# Patient Record
Sex: Female | Born: 1945
Health system: Southern US, Community
[De-identification: ages and names within clinical notes are randomized; demographics above are authoritative.]

## PROBLEM LIST (undated history)

## (undated) DIAGNOSIS — Z720 Tobacco use: Secondary | ICD-10-CM

## (undated) DIAGNOSIS — F1010 Alcohol abuse, uncomplicated: Secondary | ICD-10-CM

## (undated) DIAGNOSIS — G379 Demyelinating disease of central nervous system, unspecified: Secondary | ICD-10-CM

## (undated) HISTORY — DX: Tobacco use: Z72.0

## (undated) HISTORY — DX: Alcohol abuse, uncomplicated: F10.10

## (undated) HISTORY — DX: Demyelinating disease of central nervous system, unspecified: G37.9

---

## 2018-08-11 DIAGNOSIS — N390 Urinary tract infection, site not specified: Secondary | ICD-10-CM | POA: Diagnosis not present

## 2018-10-09 DIAGNOSIS — R634 Abnormal weight loss: Secondary | ICD-10-CM | POA: Diagnosis not present

## 2018-10-09 DIAGNOSIS — R399 Unspecified symptoms and signs involving the genitourinary system: Secondary | ICD-10-CM | POA: Diagnosis not present

## 2018-10-09 DIAGNOSIS — Z0000 Encounter for general adult medical examination without abnormal findings: Secondary | ICD-10-CM | POA: Diagnosis not present

## 2018-10-09 DIAGNOSIS — R531 Weakness: Secondary | ICD-10-CM | POA: Diagnosis not present

## 2018-10-10 DIAGNOSIS — R531 Weakness: Secondary | ICD-10-CM | POA: Diagnosis not present

## 2018-10-10 DIAGNOSIS — R399 Unspecified symptoms and signs involving the genitourinary system: Secondary | ICD-10-CM | POA: Diagnosis not present

## 2018-10-10 DIAGNOSIS — R634 Abnormal weight loss: Secondary | ICD-10-CM | POA: Diagnosis not present

## 2018-10-26 ENCOUNTER — Other Ambulatory Visit: Payer: Self-pay

## 2018-11-06 DIAGNOSIS — G992 Myelopathy in diseases classified elsewhere: Secondary | ICD-10-CM | POA: Diagnosis not present

## 2018-11-06 DIAGNOSIS — E43 Unspecified severe protein-calorie malnutrition: Secondary | ICD-10-CM | POA: Diagnosis not present

## 2018-11-06 DIAGNOSIS — R531 Weakness: Secondary | ICD-10-CM | POA: Diagnosis not present

## 2018-11-06 DIAGNOSIS — R52 Pain, unspecified: Secondary | ICD-10-CM | POA: Diagnosis not present

## 2018-11-06 DIAGNOSIS — Z20828 Contact with and (suspected) exposure to other viral communicable diseases: Secondary | ICD-10-CM | POA: Diagnosis not present

## 2018-11-06 DIAGNOSIS — M48061 Spinal stenosis, lumbar region without neurogenic claudication: Secondary | ICD-10-CM | POA: Diagnosis not present

## 2018-11-06 DIAGNOSIS — M79605 Pain in left leg: Secondary | ICD-10-CM | POA: Diagnosis not present

## 2018-11-06 DIAGNOSIS — E876 Hypokalemia: Secondary | ICD-10-CM | POA: Diagnosis not present

## 2018-11-06 DIAGNOSIS — M4804 Spinal stenosis, thoracic region: Secondary | ICD-10-CM | POA: Diagnosis not present

## 2018-11-06 DIAGNOSIS — I69828 Other speech and language deficits following other cerebrovascular disease: Secondary | ICD-10-CM | POA: Diagnosis not present

## 2018-11-06 DIAGNOSIS — M47812 Spondylosis without myelopathy or radiculopathy, cervical region: Secondary | ICD-10-CM | POA: Diagnosis not present

## 2018-11-06 DIAGNOSIS — R2681 Unsteadiness on feet: Secondary | ICD-10-CM | POA: Diagnosis not present

## 2018-11-06 DIAGNOSIS — Z743 Need for continuous supervision: Secondary | ICD-10-CM | POA: Diagnosis not present

## 2018-11-06 DIAGNOSIS — M79604 Pain in right leg: Secondary | ICD-10-CM | POA: Diagnosis not present

## 2018-11-06 DIAGNOSIS — M4322 Fusion of spine, cervical region: Secondary | ICD-10-CM | POA: Diagnosis not present

## 2018-11-06 DIAGNOSIS — R930 Abnormal findings on diagnostic imaging of skull and head, not elsewhere classified: Secondary | ICD-10-CM | POA: Diagnosis not present

## 2018-11-06 DIAGNOSIS — M48062 Spinal stenosis, lumbar region with neurogenic claudication: Secondary | ICD-10-CM | POA: Diagnosis not present

## 2018-11-06 DIAGNOSIS — R339 Retention of urine, unspecified: Secondary | ICD-10-CM | POA: Diagnosis not present

## 2018-11-06 DIAGNOSIS — N3941 Urge incontinence: Secondary | ICD-10-CM | POA: Diagnosis not present

## 2018-11-06 DIAGNOSIS — F1020 Alcohol dependence, uncomplicated: Secondary | ICD-10-CM | POA: Diagnosis not present

## 2018-11-06 DIAGNOSIS — M4712 Other spondylosis with myelopathy, cervical region: Secondary | ICD-10-CM | POA: Diagnosis not present

## 2018-11-06 DIAGNOSIS — M4807 Spinal stenosis, lumbosacral region: Secondary | ICD-10-CM | POA: Diagnosis not present

## 2018-11-06 DIAGNOSIS — M4326 Fusion of spine, lumbar region: Secondary | ICD-10-CM | POA: Diagnosis not present

## 2018-11-06 DIAGNOSIS — G35 Multiple sclerosis: Secondary | ICD-10-CM | POA: Diagnosis not present

## 2018-11-06 DIAGNOSIS — G379 Demyelinating disease of central nervous system, unspecified: Secondary | ICD-10-CM | POA: Diagnosis not present

## 2018-11-06 DIAGNOSIS — R200 Anesthesia of skin: Secondary | ICD-10-CM | POA: Diagnosis not present

## 2018-11-06 DIAGNOSIS — G9589 Other specified diseases of spinal cord: Secondary | ICD-10-CM | POA: Diagnosis not present

## 2018-11-06 DIAGNOSIS — G939 Disorder of brain, unspecified: Secondary | ICD-10-CM | POA: Diagnosis not present

## 2018-11-06 DIAGNOSIS — G959 Disease of spinal cord, unspecified: Secondary | ICD-10-CM | POA: Diagnosis not present

## 2018-11-06 DIAGNOSIS — L89153 Pressure ulcer of sacral region, stage 3: Secondary | ICD-10-CM | POA: Diagnosis not present

## 2018-11-06 DIAGNOSIS — J45909 Unspecified asthma, uncomplicated: Secondary | ICD-10-CM | POA: Diagnosis not present

## 2018-11-06 DIAGNOSIS — M5023 Other cervical disc displacement, cervicothoracic region: Secondary | ICD-10-CM | POA: Diagnosis not present

## 2018-11-06 DIAGNOSIS — F17210 Nicotine dependence, cigarettes, uncomplicated: Secondary | ICD-10-CM | POA: Diagnosis not present

## 2018-11-06 DIAGNOSIS — Z01818 Encounter for other preprocedural examination: Secondary | ICD-10-CM | POA: Diagnosis not present

## 2018-11-06 DIAGNOSIS — M4186 Other forms of scoliosis, lumbar region: Secondary | ICD-10-CM | POA: Diagnosis not present

## 2018-11-06 DIAGNOSIS — Z981 Arthrodesis status: Secondary | ICD-10-CM | POA: Diagnosis not present

## 2018-11-06 DIAGNOSIS — M5134 Other intervertebral disc degeneration, thoracic region: Secondary | ICD-10-CM | POA: Diagnosis not present

## 2018-11-06 DIAGNOSIS — M5136 Other intervertebral disc degeneration, lumbar region: Secondary | ICD-10-CM | POA: Diagnosis not present

## 2018-11-06 DIAGNOSIS — I160 Hypertensive urgency: Secondary | ICD-10-CM | POA: Diagnosis not present

## 2018-11-06 DIAGNOSIS — G729 Myopathy, unspecified: Secondary | ICD-10-CM | POA: Diagnosis not present

## 2018-11-06 DIAGNOSIS — R5381 Other malaise: Secondary | ICD-10-CM | POA: Diagnosis not present

## 2018-11-06 DIAGNOSIS — Z4789 Encounter for other orthopedic aftercare: Secondary | ICD-10-CM | POA: Diagnosis not present

## 2018-11-06 DIAGNOSIS — M5124 Other intervertebral disc displacement, thoracic region: Secondary | ICD-10-CM | POA: Diagnosis not present

## 2018-11-06 DIAGNOSIS — R079 Chest pain, unspecified: Secondary | ICD-10-CM | POA: Diagnosis not present

## 2018-11-06 DIAGNOSIS — R262 Difficulty in walking, not elsewhere classified: Secondary | ICD-10-CM | POA: Diagnosis not present

## 2018-11-06 DIAGNOSIS — M4802 Spinal stenosis, cervical region: Secondary | ICD-10-CM | POA: Diagnosis not present

## 2018-11-06 DIAGNOSIS — M5127 Other intervertebral disc displacement, lumbosacral region: Secondary | ICD-10-CM | POA: Diagnosis not present

## 2018-11-06 DIAGNOSIS — F17200 Nicotine dependence, unspecified, uncomplicated: Secondary | ICD-10-CM | POA: Diagnosis not present

## 2018-11-06 DIAGNOSIS — Z720 Tobacco use: Secondary | ICD-10-CM | POA: Diagnosis not present

## 2018-11-06 DIAGNOSIS — M419 Scoliosis, unspecified: Secondary | ICD-10-CM | POA: Diagnosis not present

## 2018-11-06 DIAGNOSIS — M5489 Other dorsalgia: Secondary | ICD-10-CM | POA: Diagnosis not present

## 2018-11-06 DIAGNOSIS — R279 Unspecified lack of coordination: Secondary | ICD-10-CM | POA: Diagnosis not present

## 2018-11-06 DIAGNOSIS — M47814 Spondylosis without myelopathy or radiculopathy, thoracic region: Secondary | ICD-10-CM | POA: Diagnosis not present

## 2018-11-06 DIAGNOSIS — M6281 Muscle weakness (generalized): Secondary | ICD-10-CM | POA: Diagnosis not present

## 2018-11-06 DIAGNOSIS — L89302 Pressure ulcer of unspecified buttock, stage 2: Secondary | ICD-10-CM | POA: Diagnosis not present

## 2018-11-06 DIAGNOSIS — I10 Essential (primary) hypertension: Secondary | ICD-10-CM | POA: Diagnosis not present

## 2018-11-06 DIAGNOSIS — R202 Paresthesia of skin: Secondary | ICD-10-CM | POA: Diagnosis not present

## 2018-11-06 DIAGNOSIS — R2689 Other abnormalities of gait and mobility: Secondary | ICD-10-CM | POA: Diagnosis not present

## 2018-11-06 DIAGNOSIS — G9389 Other specified disorders of brain: Secondary | ICD-10-CM | POA: Diagnosis not present

## 2018-11-06 DIAGNOSIS — M47816 Spondylosis without myelopathy or radiculopathy, lumbar region: Secondary | ICD-10-CM | POA: Diagnosis not present

## 2018-11-07 LAB — HEPATIC FUNCTION PANEL
ALT: 8 (ref 7–35)
ALT: 8 (ref 7–35)
ALT: 8 (ref 7–35)
AST: 17 (ref 13–35)
AST: 17 (ref 13–35)
AST: 17 (ref 13–35)
Alkaline Phosphatase: 41 (ref 25–125)
Bilirubin, Total: 0.4

## 2018-11-13 LAB — POCT INR: INR: 0.9 (ref 0.9–1.1)

## 2018-11-20 DIAGNOSIS — M4802 Spinal stenosis, cervical region: Secondary | ICD-10-CM | POA: Diagnosis not present

## 2018-11-20 DIAGNOSIS — G959 Disease of spinal cord, unspecified: Secondary | ICD-10-CM | POA: Diagnosis not present

## 2018-11-22 DIAGNOSIS — I69828 Other speech and language deficits following other cerebrovascular disease: Secondary | ICD-10-CM | POA: Diagnosis not present

## 2018-11-22 DIAGNOSIS — M6281 Muscle weakness (generalized): Secondary | ICD-10-CM | POA: Diagnosis not present

## 2018-11-22 DIAGNOSIS — R6889 Other general symptoms and signs: Secondary | ICD-10-CM | POA: Diagnosis not present

## 2018-11-22 DIAGNOSIS — Z720 Tobacco use: Secondary | ICD-10-CM | POA: Diagnosis not present

## 2018-11-22 DIAGNOSIS — M5126 Other intervertebral disc displacement, lumbar region: Secondary | ICD-10-CM | POA: Diagnosis not present

## 2018-11-22 DIAGNOSIS — M48062 Spinal stenosis, lumbar region with neurogenic claudication: Secondary | ICD-10-CM | POA: Diagnosis not present

## 2018-11-22 DIAGNOSIS — J45909 Unspecified asthma, uncomplicated: Secondary | ICD-10-CM | POA: Diagnosis not present

## 2018-11-22 DIAGNOSIS — Z9889 Other specified postprocedural states: Secondary | ICD-10-CM | POA: Diagnosis not present

## 2018-11-22 DIAGNOSIS — G379 Demyelinating disease of central nervous system, unspecified: Secondary | ICD-10-CM | POA: Diagnosis not present

## 2018-11-22 DIAGNOSIS — R339 Retention of urine, unspecified: Secondary | ICD-10-CM | POA: Diagnosis not present

## 2018-11-22 DIAGNOSIS — L8992 Pressure ulcer of unspecified site, stage 2: Secondary | ICD-10-CM | POA: Diagnosis not present

## 2018-11-22 DIAGNOSIS — R2681 Unsteadiness on feet: Secondary | ICD-10-CM | POA: Diagnosis not present

## 2018-11-22 DIAGNOSIS — Z4789 Encounter for other orthopedic aftercare: Secondary | ICD-10-CM | POA: Diagnosis not present

## 2018-11-22 DIAGNOSIS — D649 Anemia, unspecified: Secondary | ICD-10-CM | POA: Diagnosis not present

## 2018-11-22 DIAGNOSIS — R531 Weakness: Secondary | ICD-10-CM | POA: Diagnosis not present

## 2018-11-22 DIAGNOSIS — M5134 Other intervertebral disc degeneration, thoracic region: Secondary | ICD-10-CM | POA: Diagnosis not present

## 2018-11-22 DIAGNOSIS — M47814 Spondylosis without myelopathy or radiculopathy, thoracic region: Secondary | ICD-10-CM | POA: Diagnosis not present

## 2018-11-22 DIAGNOSIS — E876 Hypokalemia: Secondary | ICD-10-CM | POA: Diagnosis not present

## 2018-11-22 DIAGNOSIS — G960 Cerebrospinal fluid leak: Secondary | ICD-10-CM | POA: Diagnosis not present

## 2018-11-22 DIAGNOSIS — M4604 Spinal enthesopathy, thoracic region: Secondary | ICD-10-CM | POA: Diagnosis not present

## 2018-11-22 DIAGNOSIS — M4804 Spinal stenosis, thoracic region: Secondary | ICD-10-CM | POA: Diagnosis not present

## 2018-11-22 DIAGNOSIS — I160 Hypertensive urgency: Secondary | ICD-10-CM | POA: Diagnosis not present

## 2018-11-22 DIAGNOSIS — M5489 Other dorsalgia: Secondary | ICD-10-CM | POA: Diagnosis not present

## 2018-11-22 DIAGNOSIS — E871 Hypo-osmolality and hyponatremia: Secondary | ICD-10-CM | POA: Diagnosis not present

## 2018-11-22 DIAGNOSIS — R52 Pain, unspecified: Secondary | ICD-10-CM | POA: Diagnosis not present

## 2018-11-22 DIAGNOSIS — R29898 Other symptoms and signs involving the musculoskeletal system: Secondary | ICD-10-CM | POA: Diagnosis not present

## 2018-11-22 DIAGNOSIS — R600 Localized edema: Secondary | ICD-10-CM | POA: Diagnosis not present

## 2018-11-22 DIAGNOSIS — R262 Difficulty in walking, not elsewhere classified: Secondary | ICD-10-CM | POA: Diagnosis not present

## 2018-11-22 DIAGNOSIS — M48061 Spinal stenosis, lumbar region without neurogenic claudication: Secondary | ICD-10-CM | POA: Diagnosis not present

## 2018-11-22 DIAGNOSIS — M4606 Spinal enthesopathy, lumbar region: Secondary | ICD-10-CM | POA: Diagnosis not present

## 2018-11-22 DIAGNOSIS — G9520 Unspecified cord compression: Secondary | ICD-10-CM | POA: Diagnosis not present

## 2018-11-22 DIAGNOSIS — M47816 Spondylosis without myelopathy or radiculopathy, lumbar region: Secondary | ICD-10-CM | POA: Diagnosis not present

## 2018-11-22 DIAGNOSIS — R5381 Other malaise: Secondary | ICD-10-CM | POA: Diagnosis not present

## 2018-11-22 DIAGNOSIS — M47812 Spondylosis without myelopathy or radiculopathy, cervical region: Secondary | ICD-10-CM | POA: Diagnosis not present

## 2018-11-22 DIAGNOSIS — E43 Unspecified severe protein-calorie malnutrition: Secondary | ICD-10-CM | POA: Diagnosis not present

## 2018-11-22 DIAGNOSIS — Z743 Need for continuous supervision: Secondary | ICD-10-CM | POA: Diagnosis not present

## 2018-11-22 DIAGNOSIS — Z981 Arthrodesis status: Secondary | ICD-10-CM | POA: Diagnosis not present

## 2018-11-22 DIAGNOSIS — F1010 Alcohol abuse, uncomplicated: Secondary | ICD-10-CM | POA: Diagnosis not present

## 2018-11-22 DIAGNOSIS — M961 Postlaminectomy syndrome, not elsewhere classified: Secondary | ICD-10-CM | POA: Diagnosis not present

## 2018-11-22 DIAGNOSIS — L89153 Pressure ulcer of sacral region, stage 3: Secondary | ICD-10-CM | POA: Diagnosis not present

## 2018-11-22 DIAGNOSIS — R279 Unspecified lack of coordination: Secondary | ICD-10-CM | POA: Diagnosis not present

## 2018-11-22 DIAGNOSIS — M4802 Spinal stenosis, cervical region: Secondary | ICD-10-CM | POA: Diagnosis not present

## 2018-11-22 LAB — CBC AND DIFFERENTIAL
HCT: 22 — AB (ref 36–46)
Hemoglobin: 7.6 — AB (ref 12.0–16.0)
Platelets: 376 (ref 150–399)
WBC: 8.8

## 2018-11-22 LAB — HEPATIC FUNCTION PANEL
ALT: 8 (ref 7–35)
AST: 17 (ref 13–35)

## 2018-11-22 LAB — BASIC METABOLIC PANEL
BUN: 9 (ref 4–21)
Creatinine: 0.4 — AB (ref 0.5–1.1)
Potassium: 4 (ref 3.4–5.3)
Sodium: 135 — AB (ref 137–147)

## 2018-11-23 ENCOUNTER — Non-Acute Institutional Stay (SKILLED_NURSING_FACILITY): Payer: PPO | Admitting: Internal Medicine

## 2018-11-23 DIAGNOSIS — Z720 Tobacco use: Secondary | ICD-10-CM

## 2018-11-23 DIAGNOSIS — F1010 Alcohol abuse, uncomplicated: Secondary | ICD-10-CM | POA: Diagnosis not present

## 2018-11-23 DIAGNOSIS — R339 Retention of urine, unspecified: Secondary | ICD-10-CM

## 2018-11-23 DIAGNOSIS — E871 Hypo-osmolality and hyponatremia: Secondary | ICD-10-CM

## 2018-11-23 DIAGNOSIS — E876 Hypokalemia: Secondary | ICD-10-CM

## 2018-11-23 DIAGNOSIS — M4802 Spinal stenosis, cervical region: Secondary | ICD-10-CM | POA: Diagnosis not present

## 2018-11-23 DIAGNOSIS — M48061 Spinal stenosis, lumbar region without neurogenic claudication: Secondary | ICD-10-CM

## 2018-11-24 ENCOUNTER — Encounter: Payer: Self-pay | Admitting: Internal Medicine

## 2018-11-24 DIAGNOSIS — E871 Hypo-osmolality and hyponatremia: Secondary | ICD-10-CM | POA: Insufficient documentation

## 2018-11-24 DIAGNOSIS — Z720 Tobacco use: Secondary | ICD-10-CM | POA: Insufficient documentation

## 2018-11-24 DIAGNOSIS — R339 Retention of urine, unspecified: Secondary | ICD-10-CM | POA: Insufficient documentation

## 2018-11-24 DIAGNOSIS — E876 Hypokalemia: Secondary | ICD-10-CM | POA: Insufficient documentation

## 2018-11-24 DIAGNOSIS — F1010 Alcohol abuse, uncomplicated: Secondary | ICD-10-CM | POA: Insufficient documentation

## 2018-11-24 DIAGNOSIS — M4802 Spinal stenosis, cervical region: Secondary | ICD-10-CM | POA: Insufficient documentation

## 2018-11-24 DIAGNOSIS — M48061 Spinal stenosis, lumbar region without neurogenic claudication: Secondary | ICD-10-CM | POA: Insufficient documentation

## 2018-11-24 NOTE — Progress Notes (Signed)
This is an acute visit.  Level of care skilled.  [facility]  Chief complaint.  Acute visit status post hospitalization for spinal stenosis with lumbar stenosis status post posterior lumbar laminectomy and fusion.  History of present illness.  Patient is a pleasant 72 year old female who is here for rehab after hospitalization for numbness lower extremity weakness with diagnosed lumbar stenosis that required a posterior lumbar laminectomy.  She also has a history of of tobacco abuse as well as alcohol abuse as well as hypokalemia on admission.  Apparently before hospital admission she had been having numbness in all limbs which was getting worse.  She also had more frequent falls and needed a walker and cane to ambulate.  She did not have a primary care provider and since her numbness was getting worse she came to the ER.  MRI of the brain revealed suggestions of a demyelinating process.  .  She did have an MRI and was seen by neurology who deemed no acute demyelinating event that required IV steroids.  Cerebrospinal fluid was insufficient sample.  Recommendation by neurology was for an MRI brain and CAT scan with and without contrast in 6 months to look for any new lesions and also B12 supplementation to keep level greater than 400.  MRI did show severe multilevel spondylosis that called spinal and foraminal stenosis at multiple levels.  She did have improved movement numbness apparently after receiving a laminectomy and neurosurgery will follow.  She is on an [employer].  She also had urinary retention and does have a Foley catheter-recommendation to have this reevaluated by urology in approximately a week.  She also had low magnesium and potassium which was supplemented.  And has been started on a nicotine patch for tobacco abuse.  Regards to alcohol dependence there were no signs of withdrawal symptoms while an inpatient abstinence was advised she  also has a stage III pressure ulcer which is followed by wound care.  Currently she is lying in bed comfortably she does not have any acute complaints of pain or discomfort she continues to have significant weakness of her lower extremities but this is not new she will need extensive therapy and again follow-up by neurosurgery as well as urology.  Previous medical history  History of tobacco abuse.  History of alcohol abuse-.  History of lumbar stenosis status post laminectomy.  History of suspected demyelinating disorder in setting of brain lesions and weakness.  Urinary retention.  Hypokalemia.  Low magnesium.  Hyponatremia.  History of pressure ulcer.  Surgical history.  No pertinent surgical history.  Social history she is single she states she has lived with her daughter does have a history of significant alcohol and tobacco use in the past.  Family history reviewed no pertinent family history.  Medications.  Vitamin C 500 mg 3 times daily.  Scale twice daily.  Vitamin D 1000 units daily.  Vitamin B12 1000 mcg daily.  Folic acid 1 mg daily.  Nu-Iron 150 mg twice daily.  NicoDerm CQ 21 mg - 24 hours daily.  Oxycodone 5 mg every 4 hours as needed.  MiraLAX twice daily as needed.  Senna 2 tabs twice daily.  Zanaflex 4 mg every 6 hours as needed.  Review of systems.  In general she is not complaining of any fever or chills.  Skin does not complain of rashes or itching--she does have a stage III pressure ulcer per wound care.  Head ears eyes nose mouth and throat she  does not complain of visual changes or sore throat.  Respiratory denies shortness of breath or cough.  Cardiac does not complain of chest pain or edema.  GI is not complaining of abdominal discomfort nausea vomiting diarrhea or constipation.  GU does not complain of dysuria she does have an indwelling Foley catheter at the moment.  Musculoskeletal has significant weakness more so  lower extremities does not complain of pain however.  Neurologic continues with weakness especially lower extremities still has numbness upper and lower extremities apparently has improved somewhat since laminectomy.  Psych she does not complain of depression or anxiety she does have a history of alcohol abuse.  Physical exam.  Temperature is 98.2 pulse 82 respirations 18 blood pressure 135/76.  In general this is a pleasant elderly female in no distress she appears to be comfortable but weak.  Her skin is warm and dry she does have a stage III sacral ulcer per wound care this was not evaluated secondary to patient positioning.  Eyes visual acuity appears to be intact she does have somewhat icteric conjunctive a.  Oropharynx is clear mucous membranes moist.  Chest is clear to auscultation there is no labored breathing.  Heart is regular rate and rhythm without murmur gallop or rub she does not have significant lower extremity edema.  Abdomen is soft nontender with positive bowel sounds.  Musculoskeletal is able to move upper extremities it appears with greater strength certainly than her lower extremities she does have continued extensive lower extremity weakness is able to move her legs to some extent but this is at this point quite compromised.  Neurologic as noted above touch sensation does appear to be intact on her extremities she does still have somewhat generalized numbness but apparently this has improved since her surgery.  Cranial nerves appear to be intact her speech is clear she does have significant weakness most noticeable lower extremities bilaterally.  Psych she is alert and oriented very pleasant and appropriate.  Labs.  November 22, 2018.  WBC 8.8 hemoglobin 7.6 platelets 376.  Sodium 135 potassium 4 BUN 9 creatinine 0.39.   albumin is 3.4 otherwise liver function tests are within normal limits   Assessment and plan.  History of lower extremity weakness  numbness- thought to be C3- C7 stenosis status post laminectomy and fusion- at this point will need extensive therapy and follow-up by neurosurgery.  At this point of pain appears to be controlled she does have orders for oxycodone as needed as well as Zanaflex continue to monitor and give supportive care.  2.  History of lumbar stenosis status post lumbar laminectomy and fusion- again she did tolerate the procedure fairly well but does have continued weakness and some numbness which apparently is slowly improving recommendation for no repetitive bending twisting or lifting more than 5 pounds.  She will need again therapy.  3.  History of suspected demyelinating disorder with brain lesions and weakness- thought not to be an acute situation that required IV steroids-  CSF sample was insufficient-neurology recommended an MRI of the brain and CAT scan with and without contrast in 6 months to evaluate for any new lesions.  She has been started on B12 supplementation with goal to keep level greater than 400.  4.  History of urinary retention she does have a Foley catheter hopefully this is temporary she will need urology follow-up and a voiding trial.  5.  History of hypokalemia this was supplemented in the hospital as normalized it was 4.0 on  lab done yesterday will have this updated early next week.  6.  History of low magnesium this was supplemented as well.  7.  History of tobacco abuse she has been started on a nicotine patch.  8.  History of alcohol dependence this was stable in the hospital abstinence has been strongly encouraged.  9.  History of hyponatremia this was thought to be clinically insignificant her sodium was 135 on lab done yesterday again will keep an eye on this with updated labs next week.  10.  History of stage III pressure ulcer this has been evaluated by wound care and they will be following.  11.  History of anemia she is on iron hemoglobin was 7.6 as of yesterday  this will warrant update next week.  Again patient will need extensive rehab with follow-up by surgery as well as urology-clinically she appears to be stable but I suspect does have significant challenges ahead with regaining her strength.  [id number] note greater than 50 minutes spent assessing patient-reviewing her chart and labs-discussing her status with nursing staff- coordinating and formulating a plan of care for numerous diagnoses- of note greater than 50% of time spent coordinating a plan of care with input as noted above

## 2018-11-26 ENCOUNTER — Non-Acute Institutional Stay (SKILLED_NURSING_FACILITY): Payer: PPO | Admitting: Internal Medicine

## 2018-11-26 ENCOUNTER — Encounter: Payer: Self-pay | Admitting: Internal Medicine

## 2018-11-26 DIAGNOSIS — M48062 Spinal stenosis, lumbar region with neurogenic claudication: Secondary | ICD-10-CM

## 2018-11-26 DIAGNOSIS — F1010 Alcohol abuse, uncomplicated: Secondary | ICD-10-CM | POA: Diagnosis not present

## 2018-11-26 DIAGNOSIS — G379 Demyelinating disease of central nervous system, unspecified: Secondary | ICD-10-CM | POA: Diagnosis not present

## 2018-11-26 DIAGNOSIS — M4802 Spinal stenosis, cervical region: Secondary | ICD-10-CM | POA: Diagnosis not present

## 2018-11-26 DIAGNOSIS — R339 Retention of urine, unspecified: Secondary | ICD-10-CM

## 2018-11-26 DIAGNOSIS — L8992 Pressure ulcer of unspecified site, stage 2: Secondary | ICD-10-CM

## 2018-11-26 DIAGNOSIS — Z720 Tobacco use: Secondary | ICD-10-CM

## 2018-11-26 DIAGNOSIS — E876 Hypokalemia: Secondary | ICD-10-CM

## 2018-11-26 LAB — BASIC METABOLIC PANEL
BUN: 8 (ref 4–21)
Creatinine: 0.3 — AB (ref 0.5–1.1)
Glucose: 101
Potassium: 3.8 (ref 3.4–5.3)
Sodium: 136 — AB (ref 137–147)

## 2018-11-26 LAB — CBC AND DIFFERENTIAL
HCT: 23 — AB (ref 36–46)
Hemoglobin: 8 — AB (ref 12.0–16.0)
Neutrophils Absolute: 7
Platelets: 561 — AB (ref 150–399)
WBC: 10.8

## 2018-11-26 NOTE — Progress Notes (Signed)
: Provider:  [name]., MD Location:  [place] Room Number: [id number] Place of Service:  SNF (31)  PCP: No primary care provider on file. No care team member to display  No emergency contact information on file.     Allergies: Patient has no known allergies.  Chief Complaint  Patient presents with  . New Admit To SNF    New admission to [facility]    HPI: Patient is a 73 y.o. female with history of tobacco abuse and alcohol abuse who presented to [facility] with numbness to all extremities and ambulatory dysfunction which has been going on for the last 2 to 3 months.  Patient has had recurrent falls and has started using a walker and a cane.  She does not have a PCP.  In the ED MRI of the brain revealed ovoid periventricular T2 hyperintensities bilaterally and T2 hyperdensities involving the corpus callosum these findings are suggestive of a demyelinating process such as multiple sclerosis.  Patient is admitted to [facility] from 8/11-27.  Patient was evaluated by neurology and then orthopedist.  Patient underwent a C3-C7 laminectomy and fusion, and a posterior lumbar laminectomy and fusion L 2-5 a week earlier.  Patient is doing well postop.  Neurology did not feel like anything her medications need to be given at this time for a demyelinating process other than keeping the vitamin B12 level above 400 with vitamin B12 supplementation.  Patient had urinary retention which was not unexpected and patient is to follow-up with urology for voiding trial.  Re: Alcohol dependence there is no sign of DTs, patient did not require Ativan or Haldol.  All other problems being stable patient is admitted to skilled nursing facility for OT/PT.  While at skilled nursing facility patient will be followed for tobacco abuse treated with NicoDerm CQ patch. Past Medical History:  Diagnosis Date  . Alcohol abuse   . Demyelinating disorder  ([place])   . Tobacco abuse     History reviewed. No pertinent surgical history.  Allergies as of 11/26/2018   No Known Allergies     Medication List       Accurate as of November 26, 2018 11:21 AM. If you have any questions, ask your nurse or doctor.        calcium carbonate 500 MG chewable tablet Commonly known as: TUMS - dosed in mg elemental calcium Chew 1 tablet by mouth daily.   D3-1000 25 MCG (1000 UT) capsule Generic drug: Cholecalciferol Take 1,000 Units by mouth daily.   Ensure Take 237 mLs by mouth 2 (two) times daily between meals.   folic acid 1 MG tablet Commonly known as: FOLVITE Take 1 mg by mouth daily.   iron polysaccharides 150 MG capsule Commonly known as: NIFEREX Take 150 mg by mouth 2 (two) times daily.   nicotine 21 mg/24hr patch Commonly known as: NICODERM CQ - dosed in mg/24 hours Place 21 mg onto the skin daily.   oxyCODONE 5 MG immediate release tablet Commonly known as: Oxy IR/ROXICODONE Take 5 mg by mouth every 4 (four) hours as needed for severe pain.   polyethylene glycol 17 g packet Commonly known as: MIRALAX / GLYCOLAX Take 17 g by mouth daily.   senna-docusate 8.6-50 MG tablet Commonly known as: Senokot-S Take 1 tablet by mouth daily.   tiZANidine 4 MG tablet Commonly known as: ZANAFLEX Take 4 mg by mouth every 6 (six) hours as  needed for muscle spasms.   vitamin B-12 100 MCG tablet Commonly known as: CYANOCOBALAMIN Take 100 mcg by mouth daily.   vitamin C 500 MG tablet Commonly known as: ASCORBIC ACID Take 500 mg by mouth 3 (three) times daily.       No orders of the defined types were placed in this encounter.    There is no immunization history on file for this patient.  Social History   Tobacco Use  . Smoking status: Current Every Day Smoker    Packs/day: 0.50    Types: Cigarettes  . Smokeless tobacco: Never Used  Substance Use Topics  . Alcohol use: Never    Frequency: Never    Family history is  mother with diabetes hyperlipidemia and hypertension.  History reviewed. No pertinent family history.    Review of Systems  DATA OBTAINED: from patient, nurse GENERAL:  no fevers, fatigue, appetite changes SKIN: No itching, or rash EYES: No eye pain, redness, discharge EARS: No earache, tinnitus, change in hearing NOSE: No congestion, drainage or bleeding  MOUTH/THROAT: No mouth or tooth pain, No sore throat RESPIRATORY: No cough, wheezing, SOB CARDIAC: No chest pain, palpitations, lower extremity edema  GI: No abdominal pain, No N/V/D or constipation, No heartburn or reflux  GU: No dysuria, frequency or urgency, or incontinence  MUSCULOSKELETAL: No unrelieved bone/joint pain NEUROLOGIC: No headache, dizziness or focal weakness PSYCHIATRIC: No c/o anxiety or sadness   Vitals:   11/26/18 0918  BP: 107/70  Pulse: 81  Resp: 18  Temp: (!) 97.4 F (36.3 C)  SpO2: 100%    SpO2 Readings from Last 1 Encounters:  11/26/18 100%   Body mass index is 15.75 kg/m.     Physical Exam  GENERAL APPEARANCE: Alert, conversant,  No acute distress.  SKIN: No diaphoresis rash HEAD: Normocephalic, atraumatic  EYES: Conjunctiva/lids clear. Pupils round, reactive. EOMs intact.  EARS: External exam WNL, canals clear. Hearing grossly normal.  NOSE: No deformity or discharge.  MOUTH/THROAT: Lips w/o lesions  RESPIRATORY: Breathing is even, unlabored. Lung sounds are clear   CARDIOVASCULAR: Heart RRR no 3 murmurs, rubs or gallops. No peripheral edema.   GASTROINTESTINAL: Abdomen is soft, non-tender, not distended w/ normal bowel sounds. GENITOURINARY: Bladder non tender, not distended  MUSCULOSKELETAL: No abnormal joints or musculature NEUROLOGIC:  Cranial nerves 2-12 grossly intact. Moves all extremities, upper extremity weakness is improved lower extremity weakness slightly improved but still present PSYCHIATRIC: Mood and affect appropriate to situation, no behavioral issues  Patient  Active Problem List   Diagnosis Date Noted  . Lumbar stenosis 11/24/2018  . Cervical stenosis of spine 11/24/2018  . Urinary retention 11/24/2018  . Tobacco abuse 11/24/2018  . Alcohol abuse 11/24/2018  . Hypokalemia 11/24/2018  . Hypomagnesemia 11/24/2018  . Hyponatremia 11/24/2018      Labs reviewed: Basic Metabolic Panel:    Component Value Date/Time   NA 135 (A) 11/22/2018   K 4.0 11/22/2018   BUN 9 11/22/2018   CREATININE 0.4 (A) 11/22/2018   AST 17 11/22/2018   ALT 8 11/22/2018   ALKPHOS 41 11/07/2018    Recent Labs    11/22/18  NA 135*  K 4.0  BUN 9  CREATININE 0.4*   Liver Function Tests: Recent Labs    11/07/18 11/22/18  AST 17  17  17 17   ALT 8  8  8 8   ALKPHOS 41  --    No results for input(s): LIPASE, AMYLASE in the last 8760 hours. No results for  input(s): AMMONIA in the last 8760 hours. CBC: Recent Labs    11/22/18  WBC 8.8  HGB 7.6*  HCT 22*  PLT 376   Lipid No results for input(s): CHOL, HDL, LDLCALC, TRIG in the last 8760 hours.  Cardiac Enzymes: No results for input(s): CKTOTAL, CKMB, CKMBINDEX, TROPONINI in the last 8760 hours. BNP: No results for input(s): BNP in the last 8760 hours. No results found for: MICROALBUR No results found for: HGBA1C No results found for: TSH No results found for: VITAMINB12 No results found for: FOLATE No results found for: IRON, TIBC, FERRITIN  Imaging and Procedures obtained prior to SNF admission: Patient was never admitted.   Not all labs, radiology exams or other studies done during hospitalization come through on my EPIC note; however they are reviewed by me.    Assessment and Plan  Lumbar stenosis status post posterior lumbar laminectomy and fusion non-instrumented L2-L5 on 8/18 SNF-admitted for OT/PT  Lower extremity weakness and numbness to hands bilaterally with C3-C7 stenosis status post laminectomy and fusion on 11/20/2018- per neurosurgery after worsening of symptoms after  the lumbar laminectomy SNF- admitted for OT/PT; upper extremities seem to be improving lower extremities are improving at a much slower rate\  Demyelinating disorder with brain lesions and extremity weakness- seen by neurology who felt there was no acute demyelinating event that required IV steroids; neurology recommended MRI brain and CNS with and without contrast in 6 months to evaluate for new lesions of suggest ongoing active demyelination; B12 supplementation to keep level greater than 400 SNF- continue B12 1000 mcg daily; repeat level then 8 to 12 weeks  Urinary retention-expected in the face of surgery lower lumbar spine; voiding trial per urology SNF-follow-up urology for voiding trial  Hypokalemia/hypomagnesemia-repleted SNF- follow-up BMP  Alcohol abuse-no signs or symptoms of DTs while inpatient  Tobacco abuse SNF-continue NicoDerm CQ patch 21 mg per 24 hours  Stage II pressure ulcer-prior to arrival healing SNF- will be followed by wound care   Time spent greater than 45 minutes;> 50% of time with patient was spent reviewing records, labs, tests and studies, counseling and developing plan of care; patient in the unit requiring full PPE for every visit  [name], MD

## 2018-11-28 DIAGNOSIS — M6281 Muscle weakness (generalized): Secondary | ICD-10-CM | POA: Diagnosis not present

## 2018-11-28 DIAGNOSIS — M5134 Other intervertebral disc degeneration, thoracic region: Secondary | ICD-10-CM | POA: Diagnosis not present

## 2018-11-28 DIAGNOSIS — R600 Localized edema: Secondary | ICD-10-CM | POA: Diagnosis not present

## 2018-11-28 DIAGNOSIS — Z9889 Other specified postprocedural states: Secondary | ICD-10-CM | POA: Diagnosis not present

## 2018-11-28 DIAGNOSIS — Z4789 Encounter for other orthopedic aftercare: Secondary | ICD-10-CM | POA: Diagnosis not present

## 2018-11-28 DIAGNOSIS — G9520 Unspecified cord compression: Secondary | ICD-10-CM | POA: Diagnosis not present

## 2018-11-28 DIAGNOSIS — M4606 Spinal enthesopathy, lumbar region: Secondary | ICD-10-CM | POA: Diagnosis not present

## 2018-11-28 DIAGNOSIS — R29898 Other symptoms and signs involving the musculoskeletal system: Secondary | ICD-10-CM | POA: Diagnosis not present

## 2018-11-28 DIAGNOSIS — M4802 Spinal stenosis, cervical region: Secondary | ICD-10-CM | POA: Diagnosis not present

## 2018-11-28 DIAGNOSIS — M4804 Spinal stenosis, thoracic region: Secondary | ICD-10-CM | POA: Diagnosis not present

## 2018-11-28 DIAGNOSIS — M48061 Spinal stenosis, lumbar region without neurogenic claudication: Secondary | ICD-10-CM | POA: Diagnosis not present

## 2018-11-28 DIAGNOSIS — M47814 Spondylosis without myelopathy or radiculopathy, thoracic region: Secondary | ICD-10-CM | POA: Diagnosis not present

## 2018-11-28 DIAGNOSIS — M4604 Spinal enthesopathy, thoracic region: Secondary | ICD-10-CM | POA: Diagnosis not present

## 2018-11-28 DIAGNOSIS — M961 Postlaminectomy syndrome, not elsewhere classified: Secondary | ICD-10-CM | POA: Diagnosis not present

## 2018-11-28 DIAGNOSIS — M47816 Spondylosis without myelopathy or radiculopathy, lumbar region: Secondary | ICD-10-CM | POA: Diagnosis not present

## 2018-11-28 DIAGNOSIS — G960 Cerebrospinal fluid leak: Secondary | ICD-10-CM | POA: Diagnosis not present

## 2018-11-28 DIAGNOSIS — M47812 Spondylosis without myelopathy or radiculopathy, cervical region: Secondary | ICD-10-CM | POA: Diagnosis not present

## 2018-11-28 DIAGNOSIS — M5126 Other intervertebral disc displacement, lumbar region: Secondary | ICD-10-CM | POA: Diagnosis not present

## 2018-11-29 DIAGNOSIS — R2681 Unsteadiness on feet: Secondary | ICD-10-CM | POA: Diagnosis not present

## 2018-11-29 DIAGNOSIS — N3000 Acute cystitis without hematuria: Secondary | ICD-10-CM | POA: Diagnosis not present

## 2018-11-29 DIAGNOSIS — B966 Bacteroides fragilis [B. fragilis] as the cause of diseases classified elsewhere: Secondary | ICD-10-CM | POA: Diagnosis not present

## 2018-11-29 DIAGNOSIS — G960 Cerebrospinal fluid leak: Secondary | ICD-10-CM | POA: Diagnosis not present

## 2018-11-29 DIAGNOSIS — J45909 Unspecified asthma, uncomplicated: Secondary | ICD-10-CM | POA: Diagnosis not present

## 2018-11-29 DIAGNOSIS — M6281 Muscle weakness (generalized): Secondary | ICD-10-CM | POA: Diagnosis not present

## 2018-11-29 DIAGNOSIS — L89322 Pressure ulcer of left buttock, stage 2: Secondary | ICD-10-CM | POA: Diagnosis not present

## 2018-11-29 DIAGNOSIS — Z720 Tobacco use: Secondary | ICD-10-CM | POA: Diagnosis not present

## 2018-11-29 DIAGNOSIS — Z20828 Contact with and (suspected) exposure to other viral communicable diseases: Secondary | ICD-10-CM | POA: Diagnosis not present

## 2018-11-29 DIAGNOSIS — G061 Intraspinal abscess and granuloma: Secondary | ICD-10-CM | POA: Diagnosis not present

## 2018-11-29 DIAGNOSIS — G8220 Paraplegia, unspecified: Secondary | ICD-10-CM | POA: Diagnosis not present

## 2018-11-29 DIAGNOSIS — B9620 Unspecified Escherichia coli [E. coli] as the cause of diseases classified elsewhere: Secondary | ICD-10-CM | POA: Diagnosis not present

## 2018-11-29 DIAGNOSIS — L89154 Pressure ulcer of sacral region, stage 4: Secondary | ICD-10-CM | POA: Diagnosis not present

## 2018-11-29 DIAGNOSIS — R5082 Postprocedural fever: Secondary | ICD-10-CM | POA: Diagnosis not present

## 2018-11-29 DIAGNOSIS — T8149XA Infection following a procedure, other surgical site, initial encounter: Secondary | ICD-10-CM | POA: Diagnosis not present

## 2018-11-29 DIAGNOSIS — R279 Unspecified lack of coordination: Secondary | ICD-10-CM | POA: Diagnosis not present

## 2018-11-29 DIAGNOSIS — M9689 Other intraoperative and postprocedural complications and disorders of the musculoskeletal system: Secondary | ICD-10-CM | POA: Diagnosis not present

## 2018-11-29 DIAGNOSIS — B964 Proteus (mirabilis) (morganii) as the cause of diseases classified elsewhere: Secondary | ICD-10-CM | POA: Diagnosis not present

## 2018-11-29 DIAGNOSIS — K5641 Fecal impaction: Secondary | ICD-10-CM | POA: Diagnosis not present

## 2018-11-29 DIAGNOSIS — E43 Unspecified severe protein-calorie malnutrition: Secondary | ICD-10-CM | POA: Diagnosis not present

## 2018-11-29 DIAGNOSIS — D649 Anemia, unspecified: Secondary | ICD-10-CM | POA: Diagnosis not present

## 2018-11-29 DIAGNOSIS — N390 Urinary tract infection, site not specified: Secondary | ICD-10-CM | POA: Diagnosis not present

## 2018-11-29 DIAGNOSIS — G9782 Other postprocedural complications and disorders of nervous system: Secondary | ICD-10-CM | POA: Diagnosis not present

## 2018-11-29 DIAGNOSIS — D72829 Elevated white blood cell count, unspecified: Secondary | ICD-10-CM | POA: Diagnosis not present

## 2018-11-29 DIAGNOSIS — L89312 Pressure ulcer of right buttock, stage 2: Secondary | ICD-10-CM | POA: Diagnosis not present

## 2018-11-29 DIAGNOSIS — T8149XD Infection following a procedure, other surgical site, subsequent encounter: Secondary | ICD-10-CM | POA: Diagnosis not present

## 2018-11-29 DIAGNOSIS — T8142XA Infection following a procedure, deep incisional surgical site, initial encounter: Secondary | ICD-10-CM | POA: Diagnosis not present

## 2018-11-29 DIAGNOSIS — I10 Essential (primary) hypertension: Secondary | ICD-10-CM | POA: Diagnosis not present

## 2018-11-29 DIAGNOSIS — Z981 Arthrodesis status: Secondary | ICD-10-CM | POA: Diagnosis not present

## 2018-11-29 DIAGNOSIS — R29898 Other symptoms and signs involving the musculoskeletal system: Secondary | ICD-10-CM | POA: Diagnosis not present

## 2018-11-29 DIAGNOSIS — F1010 Alcohol abuse, uncomplicated: Secondary | ICD-10-CM | POA: Diagnosis not present

## 2018-11-29 DIAGNOSIS — R41841 Cognitive communication deficit: Secondary | ICD-10-CM | POA: Diagnosis not present

## 2018-11-29 DIAGNOSIS — T888XXA Other specified complications of surgical and medical care, not elsewhere classified, initial encounter: Secondary | ICD-10-CM | POA: Diagnosis not present

## 2018-11-29 DIAGNOSIS — T8463XA Infection and inflammatory reaction due to internal fixation device of spine, initial encounter: Secondary | ICD-10-CM | POA: Diagnosis not present

## 2018-11-29 DIAGNOSIS — R531 Weakness: Secondary | ICD-10-CM | POA: Diagnosis not present

## 2018-11-29 DIAGNOSIS — Z9889 Other specified postprocedural states: Secondary | ICD-10-CM | POA: Diagnosis not present

## 2018-11-29 DIAGNOSIS — J9811 Atelectasis: Secondary | ICD-10-CM | POA: Diagnosis not present

## 2018-11-29 DIAGNOSIS — Z4789 Encounter for other orthopedic aftercare: Secondary | ICD-10-CM | POA: Diagnosis not present

## 2018-11-29 DIAGNOSIS — R5381 Other malaise: Secondary | ICD-10-CM | POA: Diagnosis not present

## 2018-11-29 DIAGNOSIS — F17210 Nicotine dependence, cigarettes, uncomplicated: Secondary | ICD-10-CM | POA: Diagnosis not present

## 2018-11-29 DIAGNOSIS — M4636 Infection of intervertebral disc (pyogenic), lumbar region: Secondary | ICD-10-CM | POA: Diagnosis not present

## 2018-11-29 DIAGNOSIS — M48062 Spinal stenosis, lumbar region with neurogenic claudication: Secondary | ICD-10-CM | POA: Diagnosis not present

## 2018-11-29 DIAGNOSIS — L89153 Pressure ulcer of sacral region, stage 3: Secondary | ICD-10-CM | POA: Diagnosis not present

## 2018-11-29 DIAGNOSIS — Z9181 History of falling: Secondary | ICD-10-CM | POA: Diagnosis not present

## 2018-11-29 DIAGNOSIS — I959 Hypotension, unspecified: Secondary | ICD-10-CM | POA: Diagnosis not present

## 2018-11-29 DIAGNOSIS — E569 Vitamin deficiency, unspecified: Secondary | ICD-10-CM | POA: Diagnosis not present

## 2018-11-29 DIAGNOSIS — I160 Hypertensive urgency: Secondary | ICD-10-CM | POA: Diagnosis not present

## 2018-11-29 DIAGNOSIS — R509 Fever, unspecified: Secondary | ICD-10-CM | POA: Diagnosis not present

## 2018-11-29 DIAGNOSIS — Z743 Need for continuous supervision: Secondary | ICD-10-CM | POA: Diagnosis not present

## 2018-11-29 DIAGNOSIS — F17200 Nicotine dependence, unspecified, uncomplicated: Secondary | ICD-10-CM | POA: Diagnosis not present

## 2018-11-29 DIAGNOSIS — Z681 Body mass index (BMI) 19 or less, adult: Secondary | ICD-10-CM | POA: Diagnosis not present

## 2018-11-29 DIAGNOSIS — G062 Extradural and subdural abscess, unspecified: Secondary | ICD-10-CM | POA: Diagnosis not present

## 2018-11-29 DIAGNOSIS — E876 Hypokalemia: Secondary | ICD-10-CM | POA: Diagnosis not present

## 2018-11-29 DIAGNOSIS — D62 Acute posthemorrhagic anemia: Secondary | ICD-10-CM | POA: Diagnosis not present

## 2018-11-29 DIAGNOSIS — I69828 Other speech and language deficits following other cerebrovascular disease: Secondary | ICD-10-CM | POA: Diagnosis not present

## 2018-11-29 DIAGNOSIS — R339 Retention of urine, unspecified: Secondary | ICD-10-CM | POA: Diagnosis not present

## 2018-11-29 DIAGNOSIS — L8992 Pressure ulcer of unspecified site, stage 2: Secondary | ICD-10-CM | POA: Diagnosis not present

## 2018-12-02 ENCOUNTER — Encounter: Payer: Self-pay | Admitting: Internal Medicine

## 2018-12-02 DIAGNOSIS — L8992 Pressure ulcer of unspecified site, stage 2: Secondary | ICD-10-CM | POA: Insufficient documentation

## 2018-12-02 DIAGNOSIS — G379 Demyelinating disease of central nervous system, unspecified: Secondary | ICD-10-CM | POA: Insufficient documentation

## 2018-12-18 DIAGNOSIS — M48061 Spinal stenosis, lumbar region without neurogenic claudication: Secondary | ICD-10-CM | POA: Diagnosis not present

## 2018-12-18 DIAGNOSIS — R531 Weakness: Secondary | ICD-10-CM | POA: Diagnosis not present

## 2018-12-18 DIAGNOSIS — R41841 Cognitive communication deficit: Secondary | ICD-10-CM | POA: Diagnosis not present

## 2018-12-18 DIAGNOSIS — Z720 Tobacco use: Secondary | ICD-10-CM | POA: Diagnosis not present

## 2018-12-18 DIAGNOSIS — Z743 Need for continuous supervision: Secondary | ICD-10-CM | POA: Diagnosis not present

## 2018-12-18 DIAGNOSIS — R29898 Other symptoms and signs involving the musculoskeletal system: Secondary | ICD-10-CM | POA: Diagnosis not present

## 2018-12-18 DIAGNOSIS — T8149XA Infection following a procedure, other surgical site, initial encounter: Secondary | ICD-10-CM | POA: Diagnosis not present

## 2018-12-18 DIAGNOSIS — R0902 Hypoxemia: Secondary | ICD-10-CM | POA: Diagnosis not present

## 2018-12-18 DIAGNOSIS — R52 Pain, unspecified: Secondary | ICD-10-CM | POA: Diagnosis not present

## 2018-12-18 DIAGNOSIS — M4626 Osteomyelitis of vertebra, lumbar region: Secondary | ICD-10-CM | POA: Diagnosis not present

## 2018-12-18 DIAGNOSIS — Z4789 Encounter for other orthopedic aftercare: Secondary | ICD-10-CM | POA: Diagnosis not present

## 2018-12-18 DIAGNOSIS — R2681 Unsteadiness on feet: Secondary | ICD-10-CM | POA: Diagnosis not present

## 2018-12-18 DIAGNOSIS — I69828 Other speech and language deficits following other cerebrovascular disease: Secondary | ICD-10-CM | POA: Diagnosis not present

## 2018-12-18 DIAGNOSIS — Z7409 Other reduced mobility: Secondary | ICD-10-CM | POA: Diagnosis not present

## 2018-12-18 DIAGNOSIS — G379 Demyelinating disease of central nervous system, unspecified: Secondary | ICD-10-CM | POA: Diagnosis not present

## 2018-12-18 DIAGNOSIS — A499 Bacterial infection, unspecified: Secondary | ICD-10-CM | POA: Diagnosis not present

## 2018-12-18 DIAGNOSIS — B964 Proteus (mirabilis) (morganii) as the cause of diseases classified elsewhere: Secondary | ICD-10-CM | POA: Diagnosis not present

## 2018-12-18 DIAGNOSIS — L89153 Pressure ulcer of sacral region, stage 3: Secondary | ICD-10-CM | POA: Diagnosis not present

## 2018-12-18 DIAGNOSIS — D649 Anemia, unspecified: Secondary | ICD-10-CM | POA: Diagnosis not present

## 2018-12-18 DIAGNOSIS — L89154 Pressure ulcer of sacral region, stage 4: Secondary | ICD-10-CM | POA: Diagnosis not present

## 2018-12-18 DIAGNOSIS — B999 Unspecified infectious disease: Secondary | ICD-10-CM | POA: Diagnosis not present

## 2018-12-18 DIAGNOSIS — M48062 Spinal stenosis, lumbar region with neurogenic claudication: Secondary | ICD-10-CM | POA: Diagnosis not present

## 2018-12-18 DIAGNOSIS — R339 Retention of urine, unspecified: Secondary | ICD-10-CM | POA: Diagnosis not present

## 2018-12-18 DIAGNOSIS — Z7401 Bed confinement status: Secondary | ICD-10-CM | POA: Diagnosis not present

## 2018-12-18 DIAGNOSIS — D72829 Elevated white blood cell count, unspecified: Secondary | ICD-10-CM | POA: Diagnosis not present

## 2018-12-18 DIAGNOSIS — Z9181 History of falling: Secondary | ICD-10-CM | POA: Diagnosis not present

## 2018-12-18 DIAGNOSIS — B373 Candidiasis of vulva and vagina: Secondary | ICD-10-CM | POA: Diagnosis not present

## 2018-12-18 DIAGNOSIS — G062 Extradural and subdural abscess, unspecified: Secondary | ICD-10-CM | POA: Diagnosis not present

## 2018-12-18 DIAGNOSIS — E46 Unspecified protein-calorie malnutrition: Secondary | ICD-10-CM | POA: Diagnosis not present

## 2018-12-18 DIAGNOSIS — Z981 Arthrodesis status: Secondary | ICD-10-CM | POA: Diagnosis not present

## 2018-12-18 DIAGNOSIS — I10 Essential (primary) hypertension: Secondary | ICD-10-CM | POA: Diagnosis not present

## 2018-12-18 DIAGNOSIS — R509 Fever, unspecified: Secondary | ICD-10-CM | POA: Diagnosis not present

## 2018-12-18 DIAGNOSIS — E559 Vitamin D deficiency, unspecified: Secondary | ICD-10-CM | POA: Diagnosis not present

## 2018-12-18 DIAGNOSIS — E875 Hyperkalemia: Secondary | ICD-10-CM | POA: Diagnosis not present

## 2018-12-18 DIAGNOSIS — E43 Unspecified severe protein-calorie malnutrition: Secondary | ICD-10-CM | POA: Diagnosis not present

## 2018-12-18 DIAGNOSIS — F17210 Nicotine dependence, cigarettes, uncomplicated: Secondary | ICD-10-CM | POA: Diagnosis not present

## 2018-12-18 DIAGNOSIS — B9689 Other specified bacterial agents as the cause of diseases classified elsewhere: Secondary | ICD-10-CM | POA: Diagnosis not present

## 2018-12-18 DIAGNOSIS — B9620 Unspecified Escherichia coli [E. coli] as the cause of diseases classified elsewhere: Secondary | ICD-10-CM | POA: Diagnosis not present

## 2018-12-18 DIAGNOSIS — R279 Unspecified lack of coordination: Secondary | ICD-10-CM | POA: Diagnosis not present

## 2018-12-18 DIAGNOSIS — I160 Hypertensive urgency: Secondary | ICD-10-CM | POA: Diagnosis not present

## 2018-12-18 DIAGNOSIS — B372 Candidiasis of skin and nail: Secondary | ICD-10-CM | POA: Diagnosis not present

## 2018-12-18 DIAGNOSIS — Z1159 Encounter for screening for other viral diseases: Secondary | ICD-10-CM | POA: Diagnosis not present

## 2018-12-18 DIAGNOSIS — D473 Essential (hemorrhagic) thrombocythemia: Secondary | ICD-10-CM | POA: Diagnosis not present

## 2018-12-18 DIAGNOSIS — R5381 Other malaise: Secondary | ICD-10-CM | POA: Diagnosis not present

## 2018-12-18 DIAGNOSIS — L89159 Pressure ulcer of sacral region, unspecified stage: Secondary | ICD-10-CM | POA: Diagnosis not present

## 2018-12-18 DIAGNOSIS — M4802 Spinal stenosis, cervical region: Secondary | ICD-10-CM | POA: Diagnosis not present

## 2018-12-18 DIAGNOSIS — F1010 Alcohol abuse, uncomplicated: Secondary | ICD-10-CM | POA: Diagnosis not present

## 2018-12-18 DIAGNOSIS — I959 Hypotension, unspecified: Secondary | ICD-10-CM | POA: Diagnosis not present

## 2018-12-18 DIAGNOSIS — T8149XD Infection following a procedure, other surgical site, subsequent encounter: Secondary | ICD-10-CM | POA: Diagnosis not present

## 2018-12-18 DIAGNOSIS — M6281 Muscle weakness (generalized): Secondary | ICD-10-CM | POA: Diagnosis not present

## 2018-12-18 DIAGNOSIS — M4322 Fusion of spine, cervical region: Secondary | ICD-10-CM | POA: Diagnosis not present

## 2018-12-18 DIAGNOSIS — M2550 Pain in unspecified joint: Secondary | ICD-10-CM | POA: Diagnosis not present

## 2018-12-18 DIAGNOSIS — J45909 Unspecified asthma, uncomplicated: Secondary | ICD-10-CM | POA: Diagnosis not present

## 2018-12-19 ENCOUNTER — Encounter: Payer: Self-pay | Admitting: Internal Medicine

## 2018-12-19 ENCOUNTER — Non-Acute Institutional Stay (SKILLED_NURSING_FACILITY): Payer: PPO | Admitting: Internal Medicine

## 2018-12-19 DIAGNOSIS — M4802 Spinal stenosis, cervical region: Secondary | ICD-10-CM

## 2018-12-19 DIAGNOSIS — R339 Retention of urine, unspecified: Secondary | ICD-10-CM | POA: Diagnosis not present

## 2018-12-19 DIAGNOSIS — A499 Bacterial infection, unspecified: Secondary | ICD-10-CM

## 2018-12-19 DIAGNOSIS — M48062 Spinal stenosis, lumbar region with neurogenic claudication: Secondary | ICD-10-CM | POA: Diagnosis not present

## 2018-12-19 DIAGNOSIS — Z720 Tobacco use: Secondary | ICD-10-CM

## 2018-12-19 NOTE — Progress Notes (Signed)
Location:  [place] Room Number: 114-P Place of Service:  SNF [phone]) Provider:  Granville Lewis, PA-C  Patient Care Team: [name], MD as PCP - General (Internal Medicine)  Extended Emergency Contact Information Primary Emergency Contact: Alycia Rossetti Mobile Phone: [phone] Relation: Aunt  Code Status:  DNR Goals of care: Advanced Directive information Advanced Directives 11/26/2018  Does Patient Have a Medical Advance Directive? Yes  Type of Advance Directive Out of facility DNR (pink MOST or yellow form)  Does patient want to make changes to medical advance directive? No - Patient declined  Pre-existing out of facility DNR order (yellow form or pink MOST form) Yellow form placed in chart (order not valid for inpatient use)     Chief Complaint  Patient presents with  . Acute Visit    Patient is seen for hospital followup   Status post hospitalization for irrigation and debridement of a posterior lumbar wound with dural repair.   HPI:  Pt is a 73 y.o. female seen today for admission to facility after hospitalization for the above-stated procedure.  Patient was here previously for rehab after undergoing a lumbar decompression followed by cervical decompression.  She had continued weakness of her lower extremities however and on visit to orthopedics she was sent to the hospital for concerns of the increased weakness.  Imaging found that she had a dorsal epidural collection with stenosis of the lumbar spine.  She had an operative irrigation and debridement of the infection and tolerated the procedure well.  She was treated aggressively with antibiotics secondary to a culture showing multiple bacteria.  Initially was treated with vancomycin and cefepime I am and ampicillin was then instituted as well as ciprofloxacin with the recommended 8 weeks of antibiotic therapy to end on November 3.  Culture also showed Bacteroids Fragilis--and  recommendation is for Flagyl for 6 weeks  Currently she is resting comfortably she does have a cervical collar vital signs are stable she is afebrile she says her pain is controlled.  In regards to her other issues she does have a stage IV decubitus ulcer this is being followed by wound care.  She also has a history of urinary retention and has an indwelling Foley catheter at some point I suspect she will need a voiding trial/r urology follow-up  She also has a history of anemia and continues on iron hemoglobin was 8.1 on September 16.  She also has a diagnosis of protein calorie malnutrition and continues on supplements and is also on Remeron.  There are orders to do weekly labs and notify infectious disease at [facility] of the results.       Past Medical History:  Diagnosis Date  . Alcohol abuse   . Demyelinating disorder ([place])   . Tobacco abuse    History reviewed. No pertinent surgical history.  No Known Allergies  Outpatient Encounter Medications as of 12/19/2018  Medication Sig  . acetaminophen (TYLENOL) 500 MG tablet Take 1,000 mg by mouth every 6 (six) hours as needed for mild pain or headache.  . calcium carbonate (TUMS - DOSED IN MG ELEMENTAL CALCIUM) 500 MG chewable tablet Chew 1 tablet by mouth daily.  . Cholecalciferol (D3-1000) 25 MCG (1000 UT) capsule Take 1,000 Units by mouth daily.  . ciprofloxacin (CIPRO) 750 MG tablet Take 750 mg by mouth 2 (two) times daily.  . folic acid (FOLVITE) 1 MG tablet Take 1 mg by mouth daily.  Marland Kitchen iron  polysaccharides (NIFEREX) 150 MG capsule Take 150 mg by mouth 2 (two) times daily.  . metroNIDAZOLE (FLAGYL) 500 MG tablet Take 500 mg by mouth 3 (three) times daily.  . mirtazapine (REMERON) 30 MG tablet Take 30 mg by mouth at bedtime.  . nicotine (NICODERM CQ - DOSED IN MG/24 HOURS) 21 mg/24hr patch Place 21 mg onto the skin daily.  Marland Kitchen oxycodone (OXY-IR) 5 MG capsule Take 5 mg by mouth every 6 (six) hours as  needed for pain.  . polyethylene glycol (MIRALAX / GLYCOLAX) 17 g packet Take 17 g by mouth daily.  . Prenat-FeCbn-FeBisg-FA-Omega (MULTIVITAMIN/MINERALS PO) Take 1 tablet by mouth daily.  Marland Kitchen senna-docusate (SENOKOT-S) 8.6-50 MG tablet Take 2 tablets by mouth 2 (two) times daily.   . sodium chloride 0.9 % SOLN 100 mL with ampicillin 2 g SOLR 2 g Inject 2 g into the vein every 4 (four) hours.  . thiamine 100 MG tablet Take 100 mg by mouth daily.  . vitamin B-12 (CYANOCOBALAMIN) 1000 MCG tablet Take 1,000 mcg by mouth daily.  . vitamin C (ASCORBIC ACID) 500 MG tablet Take 500 mg by mouth 3 (three) times daily.  . [DISCONTINUED] Ensure (ENSURE) Take 237 mLs by mouth 2 (two) times daily between meals.   No facility-administered encounter medications on file as of 12/19/2018.     Review of Systems In general she is not complaining of any fever or chills.  Skin does not complain of rashes or itching she does have a history of a sacral ulcer followed by wound care.  Head ears eyes nose mouth and throat is not complaining of visual changes or sore throat.  Respiratory is not complaining of having a cough or shortness of breath.  Cardiac does not complain of chest pain or edema.  GI does not complain of abdominal discomfort nausea vomiting diarrhea or constipation-continues to have somewhat of a poor appetite.  GU she is not complaining of dysuria.  Musculoskeletal at this point is not complaining of significant pain she says current pain medications appear to be effective.  Neurologic she is not complaining of dizziness headache says the numbness in her hands has improved.  Psych does not complain of being depressed or anxious she does have a previous history of alcohol abuse.    There is no immunization history on file for this patient. Pertinent  Health Maintenance Due  Topic Date Due  . MAMMOGRAM  07/29/1996  . COLONOSCOPY  07/29/1996  . DEXA SCAN  07/30/2011  . PNA vac Low Risk  Adult (1 of 2 - PCV13) 07/30/2011  . INFLUENZA VACCINE  10/27/2018   No flowsheet data found. Functional Status Survey:    Vitals:   12/19/18 1129  BP: 110/62  Pulse: 78  Resp: 18  Temp: (!) 97.1 F (36.2 C)  TempSrc: Oral  SpO2: 100%  Weight: 103 lb 9.6 oz (47 kg)  Height: 5\' 8"  (1.727 m)   Body mass index is 15.75 kg/m. Physical Exam  In general this is a pleasant elderly female no distress resting comfortably in bed she does have a cervical collar on.  Her skin is warm and dry sacral wound could not be assessed secondary to patient positioning this is followed by wound care.  She does have a PICC line in the right upper arm.    Eyes visual acuity appears to be intact sclera and conjunctive are clear.  Oropharynx is clear mucous membranes moist.  Chest is clear to auscultation there is no labored  breathing.  Heart is regular rate and rhythm she does not have lower extremity edema.   Abdomen is soft nontender with positive bowel sounds.  GU she does have a an indwelling Foley catheter draining amber-colored urine.  Musculoskeletal Limited exam since she is in bed but is able to move all extremities x4 with lower extremity weakness assessment was somewhat difficult since she is lying in bed.  Neurologic she says her numbness has improved touch sensation is intact all extremities she is able to move all extremities but has lower extremity weakness-she says she still has some slight residual numbness of her fingers but this has improved.  Psych she is alert and oriented pleasant and appropriate  Labs reviewed:  September 16  hemoglobin was 8.1   November 28, 2018.  WBC 9.3 hemoglobin 9.1 platelets 718.  Sodium 135 potassium 3.6 BUN 32 creatinine 0.34.  Liver function tests within normal limits Recent Labs    11/22/18 11/26/18  NA 135* 136*  K 4.0 3.8  BUN 9 8  CREATININE 0.4* 0.3*   Recent Labs    11/07/18 11/22/18  AST 17  17  17 17   ALT 8  8   8 8   ALKPHOS 41  --    Recent Labs    11/22/18 11/26/18  WBC 8.8 10.8  NEUTROABS  --  7  HGB 7.6* 8.0*  HCT 22* 23*  PLT 376 561*   No results found for: TSH No results found for: HGBA1C No results found for: CHOL, HDL, LDLCALC, LDLDIRECT, TRIG, CHOLHDL  Significant Diagnostic Results in last 30 days:  No results found.  Assessment/Plan  #1 history of lumbar fusion in August followed by cervical fusion with severe degenerative joint disease and weakness. Diagnosed with cerebral spinal fluid leak with stenosis of lumbar spine status post dural repair with a postop lumbar wound infection that required I&D.  Again that the culture did show multiple organisms treated with vancomycin and cephapirin switched to ampicillin and ciprofloxacin for intended 8-week course to end on November 3.  She is also on Flagyl with culture growing bacteroids fragilis and will be treated with Flagyl for 6 weeks  Currently she states her pain is controlled she does have orders for oxycodone 5 mg every 6 hours as well as Tylenol as needed  She does have orders for weekly CBC with differential and metabolic panel and infectious disease to be notified of results  #2 history of anemia she is on iron 150 mg twice daily hemoglobin was 9.1 in hospital again serial labs will be monitored  #3 history of stage IV decubitus ulcer this is being treated with Verdene Lennert solution and followed closely by wound care and I suspect will be followed by the wound care physician assistant as well.  4.  Previous history of tobacco abuse she does continue on nicotine patch.  5.  History of alcohol abuse-no evidence of DTs this apparently has been stable now for some time since her initial hospitalization.  6.  History of protein calorie malnutrition she is on supplements as well as Remeron to help with appetite stimulation at this point will monitor she says her appetite is still not great so this will have to be watched.   [id number] note greater than 40 minutes spent assessing patient-reviewing her chart and labs- coordinating and formulating a plan of care for numerous diagnoses- of note greater than 50% of time spent coordinating a plan of care with input as noted above

## 2018-12-21 ENCOUNTER — Encounter: Payer: Self-pay | Admitting: Internal Medicine

## 2018-12-21 DIAGNOSIS — T8149XA Infection following a procedure, other surgical site, initial encounter: Secondary | ICD-10-CM | POA: Diagnosis not present

## 2018-12-21 DIAGNOSIS — Z981 Arthrodesis status: Secondary | ICD-10-CM | POA: Diagnosis not present

## 2018-12-21 DIAGNOSIS — Z7409 Other reduced mobility: Secondary | ICD-10-CM | POA: Diagnosis not present

## 2018-12-21 DIAGNOSIS — A499 Bacterial infection, unspecified: Secondary | ICD-10-CM | POA: Insufficient documentation

## 2018-12-21 DIAGNOSIS — B999 Unspecified infectious disease: Secondary | ICD-10-CM | POA: Diagnosis not present

## 2018-12-24 ENCOUNTER — Encounter: Payer: Self-pay | Admitting: Internal Medicine

## 2018-12-24 ENCOUNTER — Non-Acute Institutional Stay (SKILLED_NURSING_FACILITY): Payer: PPO | Admitting: Internal Medicine

## 2018-12-24 DIAGNOSIS — E43 Unspecified severe protein-calorie malnutrition: Secondary | ICD-10-CM | POA: Diagnosis not present

## 2018-12-24 DIAGNOSIS — A499 Bacterial infection, unspecified: Secondary | ICD-10-CM | POA: Diagnosis not present

## 2018-12-24 DIAGNOSIS — F1010 Alcohol abuse, uncomplicated: Secondary | ICD-10-CM | POA: Diagnosis not present

## 2018-12-24 DIAGNOSIS — Z720 Tobacco use: Secondary | ICD-10-CM | POA: Diagnosis not present

## 2018-12-24 DIAGNOSIS — L89154 Pressure ulcer of sacral region, stage 4: Secondary | ICD-10-CM | POA: Diagnosis not present

## 2018-12-24 DIAGNOSIS — D649 Anemia, unspecified: Secondary | ICD-10-CM

## 2018-12-24 DIAGNOSIS — E559 Vitamin D deficiency, unspecified: Secondary | ICD-10-CM

## 2018-12-24 DIAGNOSIS — M4626 Osteomyelitis of vertebra, lumbar region: Secondary | ICD-10-CM | POA: Diagnosis not present

## 2018-12-24 DIAGNOSIS — D539 Nutritional anemia, unspecified: Secondary | ICD-10-CM | POA: Insufficient documentation

## 2018-12-24 LAB — CBC AND DIFFERENTIAL
HCT: 24 — AB (ref 36–46)
Hemoglobin: 7.7 — AB (ref 12.0–16.0)
Neutrophils Absolute: 5
Platelets: 705 — AB (ref 150–399)
WBC: 9.3

## 2018-12-24 LAB — HEPATIC FUNCTION PANEL
ALT: 6 — AB (ref 7–35)
AST: 25 (ref 13–35)
Alkaline Phosphatase: 58 (ref 25–125)
Bilirubin, Total: 0.2

## 2018-12-24 LAB — BASIC METABOLIC PANEL
BUN: 6 (ref 4–21)
Creatinine: 0.3 — AB (ref 0.5–1.1)
Glucose: 66
Potassium: 4.9 (ref 3.4–5.3)
Sodium: 138 (ref 137–147)

## 2018-12-24 NOTE — Progress Notes (Signed)
: Provider:  [name]., MD Location:  [place] Room Number: 114-P Place of Service:  SNF [phone])  PCP: [name], MD Patient Care Team: [name], MD as PCP - General (Internal Medicine)  Extended Emergency Contact Information Primary Emergency Contact: Sharon Seller Relation: Daughter Secondary Emergency Contact: Alycia Rossetti Mobile Phone: [phone] Relation: Aunt     Allergies: Patient has no known allergies.  Chief Complaint  Patient presents with  . New Admit To SNF    HPI: Patient is 73 y.o. female with history of tobacco abuse alcohol abuse anemia severe protein calorie malnutrition who presented initially with difficulty walking.  She had a lumbar decompression done and she improved.  Her wound had been completely clean and she had no signs of infection.  Patient was reopened, the area was lavaged, a portion of the thecal sac was oversewn secondary to extravasation of CSF.  A VAC dressing was applied.  On 9/8 the patient returned to the operating room again for repeat irrigation and debridement down to the bone.  OR cultures grew out enterococcus, E. coli, Proteus mirabilis, Morganella morganii.  Patient's vancomycin and cefepime were DC'd and patient is started on ampicillin 12 g a day continuous infusion and ciprofloxacin 750 mg p.o. twice daily for 8 weeks with a start date of 9/8 and end date of 11/3..  On 9/18 patient had repeat I&D for increased drainage output and cultures grew out Bacteroides after which Flagyl was added to the regimen until 11/3.  Hospital course was further complicated by stage IV decubitus ulcer that was debrided at the bedside x2 by plastic surgery and by severe protein calorie malnutrition.  Patient is admitted to skilled nursing facility for OT/PT.  While at skilled nursing facility patient will be followed for tobacco abuse treated with NicoDerm patch, alcohol abuse treated with vitamin B12  replacement and folate replacement, and vitamin D deficiency treated with daily replacement.  Past Medical History:  Diagnosis Date  . Alcohol abuse   . Demyelinating disorder ([place])   . Tobacco abuse     No past surgical history on file.  Allergies as of 12/24/2018   No Known Allergies     Medication List       Accurate as of December 24, 2018  8:38 PM. If you have any questions, ask your nurse or doctor.        STOP taking these medications   oxycodone 5 MG capsule Commonly known as: OXY-IR Stopped by: Inocencio Homes, MD     TAKE these medications   acetaminophen 500 MG tablet Commonly known as: TYLENOL Take 1,000 mg by mouth every 8 (eight) hours as needed for mild pain or headache.   ampicillin 2 g injection Commonly known as: OMNIPEN Inject 2 g into the vein every 4 (four) hours.   calcium carbonate 500 MG chewable tablet Commonly known as: TUMS - dosed in mg elemental calcium Chew 1 tablet by mouth daily.   ciprofloxacin 750 MG tablet Commonly known as: CIPRO Take 750 mg by mouth 2 (two) times daily.   D3-1000 25 MCG (1000 UT) capsule Generic drug: Cholecalciferol Take 1,000 Units by mouth daily.   ENSURE ENLIVE PO Take 237 mLs by mouth 3 (three) times daily.   feeding supplement (PRO-STAT SUGAR FREE 64) Liqd Take 30 mLs by mouth 2 (two) times daily.   folic acid 1 MG tablet Commonly known as: FOLVITE Take 1 mg by mouth daily.  iron polysaccharides 150 MG capsule Commonly known as: NIFEREX Take 150 mg by mouth 2 (two) times daily. What changed: Another medication with the same name was removed. Continue taking this medication, and follow the directions you see here. Changed by: Inocencio Homes, MD   metroNIDAZOLE 500 MG tablet Commonly known as: FLAGYL Take 500 mg by mouth 3 (three) times daily.   mirtazapine 30 MG tablet Commonly known as: REMERON Take 30 mg by mouth at bedtime.   multivitamin with minerals tablet Take 1 tablet by mouth  daily.   nicotine 21 mg/24hr patch Commonly known as: NICODERM CQ - dosed in mg/24 hours Place 21 mg onto the skin daily.   polyethylene glycol 17 g packet Commonly known as: MIRALAX / GLYCOLAX Take 17 g by mouth 2 (two) times daily.   senna-docusate 8.6-50 MG tablet Commonly known as: Senokot-S Take 2 tablets by mouth 2 (two) times daily.   sodium chloride 0.9 % SOLN 100 mL with ampicillin 2 g SOLR 2 g Inject 2 g into the vein every 4 (four) hours.   thiamine 100 MG tablet Take 100 mg by mouth daily.   vitamin B-12 1000 MCG tablet Commonly known as: CYANOCOBALAMIN Take 1,000 mcg by mouth daily.   vitamin C 500 MG tablet Commonly known as: ASCORBIC ACID Take 500 mg by mouth 3 (three) times daily. With meals       No orders of the defined types were placed in this encounter.    There is no immunization history on file for this patient.  Social History   Tobacco Use  . Smoking status: Current Every Day Smoker    Packs/day: 0.50    Types: Cigarettes  . Smokeless tobacco: Never Used  Substance Use Topics  . Alcohol use: Never    Frequency: Never    Family history is mom with diabetes and hypertension  No family history on file.    Review of Systems  DATA OBTAINED: from patient GENERAL:  no fevers, fatigue, appetite changes SKIN: No itching, or rash EYES: No eye pain, redness, discharge EARS: No earache, tinnitus, change in hearing NOSE: No congestion, drainage or bleeding  MOUTH/THROAT: No mouth or tooth pain, No sore throat RESPIRATORY: No cough, wheezing, SOB CARDIAC: No chest pain, palpitations, lower extremity edema  GI: No abdominal pain, No N/V/D or constipation, No heartburn or reflux  GU: No dysuria, frequency or urgency, or incontinence  MUSCULOSKELETAL: No unrelieved bone/joint pain NEUROLOGIC: No headache, dizziness or focal weakness PSYCHIATRIC: No c/o anxiety or sadness   Vitals:   12/24/18 1239  BP: 112/72  Pulse: 89  Resp: 17   Temp: 98.4 F (36.9 C)  SpO2: 100%    SpO2 Readings from Last 1 Encounters:  12/24/18 100%   Body mass index is 15.75 kg/m.     Physical Exam  GENERAL APPEARANCE: Alert, conversant,  No acute distress.  SKIN: No diaphoresis rash HEAD: Normocephalic, atraumatic  EYES: Conjunctiva/lids clear. Pupils round, reactive. EOMs intact.  EARS: External exam WNL, canals clear. Hearing grossly normal.  NOSE: No deformity or discharge.  MOUTH/THROAT: Lips w/o lesions  RESPIRATORY: Breathing is even, unlabored. Lung sounds are clear   CARDIOVASCULAR: Heart RRR no murmurs, rubs or gallops. No peripheral edema.   GASTROINTESTINAL: Abdomen is soft, non-tender, not distended w/ normal bowel sounds. GENITOURINARY: Bladder non tender, not distended  MUSCULOSKELETAL: No abnormal joints or musculature except very thin NEUROLOGIC:  Cranial nerves 2-12 grossly intact. Moves all extremities, with right leg weakness PSYCHIATRIC: Mood  and affect appropriate to situation, no behavioral issues  Patient Active Problem List   Diagnosis Date Noted  . Polymicrobial bacterial infection 12/21/2018  . Demyelinating disease of central nervous system ([place]) 12/02/2018  . Pressure ulcer, stage II ([place]) 12/02/2018  . Lumbar stenosis 11/24/2018  . Cervical stenosis of spine 11/24/2018  . Urinary retention 11/24/2018  . Tobacco abuse 11/24/2018  . Alcohol abuse 11/24/2018  . Hypokalemia 11/24/2018  . Hypomagnesemia 11/24/2018  . Hyponatremia 11/24/2018      Labs reviewed: Basic Metabolic Panel:    Component Value Date/Time   NA 136 (A) 11/26/2018   K 3.8 11/26/2018   BUN 8 11/26/2018   CREATININE 0.3 (A) 11/26/2018   AST 17 11/22/2018   ALT 8 11/22/2018   ALKPHOS 41 11/07/2018    Recent Labs    11/22/18 11/26/18  NA 135* 136*  K 4.0 3.8  BUN 9 8  CREATININE 0.4* 0.3*   Liver Function Tests: Recent Labs    11/07/18 11/22/18  AST 17  17  17 17   ALT 8  8  8 8   ALKPHOS 41  --    No  results for input(s): LIPASE, AMYLASE in the last 8760 hours. No results for input(s): AMMONIA in the last 8760 hours. CBC: Recent Labs    11/22/18 11/26/18  WBC 8.8 10.8  NEUTROABS  --  7  HGB 7.6* 8.0*  HCT 22* 23*  PLT 376 561*   Lipid No results for input(s): CHOL, HDL, LDLCALC, TRIG in the last 8760 hours.  Cardiac Enzymes: No results for input(s): CKTOTAL, CKMB, CKMBINDEX, TROPONINI in the last 8760 hours. BNP: No results for input(s): BNP in the last 8760 hours. No results found for: MICROALBUR No results found for: HGBA1C No results found for: TSH No results found for: VITAMINB12 No results found for: FOLATE No results found for: IRON, TIBC, FERRITIN  Imaging and Procedures obtained prior to SNF admission: Patient was never admitted.   Not all labs, radiology exams or other studies done during hospitalization come through on my EPIC note; however they are reviewed by me.    Assessment and Plan  Polymicrobial infection of the lumbar spine/osteomyelitis- status post lumbar laminectomy L2-5 on 8/18 and cervical laminectomy on 8/25 with letter wound repair on lumbar spine along with repair of a dural leak.  Subsequent infection and re-irrigation of the wound with patient wound VAC.  At that time cultures grew out E. coli Proteus enterococcus and Morganella and patient was started on IV ampicillin and Cipro until end date of 11/3.  Later Bacteroides grew out of a drain discharge and Flagyl 500 mg every 8 was added until 11/3. SNF-patient is admitted to skilled nursing facility for OT/PT  Stage IV sacral decubitus ulcer-debridement at bedside x2 by plastic surgery SNF- air mattress and wound care  Severe protein calorie malnutrition-muscle and fat loss, poor appetite SNF-encourage p.o. intake protein supplementation meal supplementation; consider feeding tube if appetite remains poor  Normocytic anemia-hemoglobin 8.1 on 9/16 SNF- follow-up CBC and continue iron  supplementation twice daily  History of tobacco abuse SNF- continue nicotine 21 mg patch to skin daily  History of alcohol abuse SNF- continue vitamin to B12 1000 mcg daily and folate 1 mg daily  Vitamin D deficiency SNF- continue thousand units daily   Time spent greater than 45 minutes;> 50% of time with patient was spent reviewing records, labs, tests and studies, counseling and developing plan of care  [name], MD

## 2018-12-26 DIAGNOSIS — M4802 Spinal stenosis, cervical region: Secondary | ICD-10-CM | POA: Diagnosis not present

## 2018-12-26 DIAGNOSIS — B9689 Other specified bacterial agents as the cause of diseases classified elsewhere: Secondary | ICD-10-CM | POA: Diagnosis not present

## 2018-12-26 DIAGNOSIS — G062 Extradural and subdural abscess, unspecified: Secondary | ICD-10-CM | POA: Diagnosis not present

## 2018-12-26 DIAGNOSIS — D72829 Elevated white blood cell count, unspecified: Secondary | ICD-10-CM | POA: Diagnosis not present

## 2018-12-26 DIAGNOSIS — B964 Proteus (mirabilis) (morganii) as the cause of diseases classified elsewhere: Secondary | ICD-10-CM | POA: Diagnosis not present

## 2018-12-26 DIAGNOSIS — B9620 Unspecified Escherichia coli [E. coli] as the cause of diseases classified elsewhere: Secondary | ICD-10-CM | POA: Diagnosis not present

## 2018-12-26 DIAGNOSIS — R509 Fever, unspecified: Secondary | ICD-10-CM | POA: Diagnosis not present

## 2018-12-26 DIAGNOSIS — M48061 Spinal stenosis, lumbar region without neurogenic claudication: Secondary | ICD-10-CM | POA: Diagnosis not present

## 2018-12-26 DIAGNOSIS — L89159 Pressure ulcer of sacral region, unspecified stage: Secondary | ICD-10-CM | POA: Diagnosis not present

## 2018-12-26 DIAGNOSIS — R531 Weakness: Secondary | ICD-10-CM | POA: Diagnosis not present

## 2018-12-27 ENCOUNTER — Non-Acute Institutional Stay (SKILLED_NURSING_FACILITY): Payer: PPO | Admitting: Internal Medicine

## 2018-12-27 DIAGNOSIS — M2550 Pain in unspecified joint: Secondary | ICD-10-CM | POA: Diagnosis not present

## 2018-12-27 DIAGNOSIS — E43 Unspecified severe protein-calorie malnutrition: Secondary | ICD-10-CM | POA: Diagnosis not present

## 2018-12-27 DIAGNOSIS — Z4789 Encounter for other orthopedic aftercare: Secondary | ICD-10-CM | POA: Diagnosis not present

## 2018-12-27 DIAGNOSIS — A499 Bacterial infection, unspecified: Secondary | ICD-10-CM

## 2018-12-27 DIAGNOSIS — D649 Anemia, unspecified: Secondary | ICD-10-CM | POA: Diagnosis not present

## 2018-12-27 LAB — POCT ERYTHROCYTE SEDIMENTATION RATE, NON-AUTOMATED: Sed Rate: 109

## 2018-12-27 NOTE — Progress Notes (Signed)
This is an acute visit.  Level care skilled.  [facility]  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  History of present illness.  Patient is a pleasant 73 year old female here for rehab after somewhat complicated hospital course.  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  She had subsequent infection and re-irrigation of the wound with patient wound VAC.  She had cultures that grew out E. coli Proteus and enterococcus and Morganella and was started on IV ampicillin and Cipro until November 3 later bacteroids grew out of a drain and Flagyl was instituted as well until November 3.  She is followed by infectious disease who have ordered weekly labs.  Currently she is resting in bed comfortably she is afebrile she says she is feeling better.  Still appears quite weak she continues in cervical collar  She does have a history of protein calorie malnutrition she is on Remeron and supplements and per staff her weight is stabilized and she feels she is gradually getting a better appetite and eating better but this will have to be monitored.  She does have anemia with I suspect an element of chronic disease recent hemoglobins have been in the sevens to low eights it was 7.7 on the lab done on September 28 again this is followed by infectious disease as well but this appears to be relatively stable although low.--She is on iron twice a day  She also has a history of a sacral ulcer thought to be stage IV this apparently was debrided by plastic surgery-and is followed by wound care here.  Past Medical History:  Diagnosis Date  . Alcohol abuse   . Demyelinating disorder ([place])   . Tobacco  abuse     No past surgical history on file.  Allergies as of 12/24/2018   No Known Allergies        Medication List                   TAKE these medications   acetaminophen 500 MG tablet Commonly known as: TYLENOL Take 1,000 mg by mouth every 8 (eight) hours as needed for mild pain or headache.   ampicillin 2 g injection Commonly known as: OMNIPEN Inject 2 g into the vein every 4 (four) hours.   calcium carbonate 500 MG chewable tablet Commonly known as: TUMS - dosed in mg elemental calcium Chew 1 tablet by mouth daily.   ciprofloxacin 750 MG tablet Commonly known as: CIPRO Take 750 mg by mouth 2 (two) times daily.   D3-1000 25 MCG (1000 UT) capsule Generic drug: Cholecalciferol Take 1,000 Units by mouth daily.   ENSURE ENLIVE PO Take 237 mLs by mouth 3 (three) times daily.   feeding supplement (PRO-STAT SUGAR FREE 64) Liqd Take 30 mLs by mouth 2 (two) times daily.   folic acid 1 MG tablet Commonly known as: FOLVITE Take 1 mg by mouth daily.   iron polysaccharides 150 MG capsule Commonly known as: NIFEREX Take 150 mg by mouth 2 (two) times daily. What changed: Another medication with the same name was removed. Continue taking this medication, and follow the directions you see here. Changed by: Inocencio Homes, MD   metroNIDAZOLE 500 MG tablet Commonly known as:  FLAGYL Take 500 mg by mouth 3 (three) times daily.   mirtazapine 30 MG tablet Commonly known as: REMERON Take 30 mg by mouth at bedtime.   multivitamin with minerals tablet Take 1 tablet by mouth daily.   nicotine 21 mg/24hr patch Commonly known as: NICODERM CQ - dosed in mg/24 hours Place 21 mg onto the skin daily.   polyethylene glycol 17 g packet Commonly known as: MIRALAX / GLYCOLAX Take 17 g by mouth 2 (two) times daily.   senna-docusate 8.6-50 MG tablet Commonly known as: Senokot-S Take 2 tablets by mouth 2 (two) times daily.   sodium chloride 0.9 % SOLN  100 mL with ampicillin 2 g SOLR 2 g Inject 2 g into the vein every 4 (four) hours.   thiamine 100 MG tablet Take 100 mg by mouth daily.   vitamin B-12 1000 MCG tablet Commonly known as: CYANOCOBALAMIN Take 1,000 mcg by mouth daily.   vitamin C 500 MG tablet Commonly known as: ASCORBIC ACID Take 500 mg by mouth 3 (three) times daily. With meals       No orders of the defined types were placed in this encounter.    There is no immunization history on file for this patient.  Social History        Tobacco Use  . Smoking status: Current Every Day Smoker    Packs/day: 0.50    Types: Cigarettes  . Smokeless tobacco: Never Used  Substance Use Topics  . Alcohol use: Never    Frequency: Never    Family history is mom with diabetes and hypertension   Review of systems.  In general she is not complaining of any fever or chills.  Skin does have a history of the sacral ulcer as noted above does not complain of diaphoresis rashes or itching.  Head ears eyes nose mouth and throat does not complain of visual changes or sore throat.  Respiratory is not complaining of shortness of breath or cough.  Cardiac does not complain of chest pain or edema.  GI is not complaining of abdominal discomfort nausea vomiting diarrhea constipation says her appetite is getting a bit better.  GU is not complaining of dysuria.  Musculoskeletal continues with extensive weakness especially lower extremities does not complain of pain however.  Neurologic positive for weakness she does not complain of dizziness or headache says the numbness of her upper extremities is gradually improving.  And psych does not complain of overt anxiety or depression says she is motivated to once again ambulate again at some point    Physical exam.  Temperature is 97.8-pulse 78 respirations 18 blood pressures 97/80--132/70 in that range today.  In general this is a pleasant elderly female in no  distress resting comfortably in bed.  Her skin is warm and dry.  Eyes visual acuity appears to be intact sclera and conjunctive are clear.  Neck she does have a cervical collar in place.  Oropharynx is clear mucous membranes moist.  Chest is clear to auscultation there is no labored breathing.  Heart is regular rate and rhythm without murmur gallop or rub she does not have significant lower extremity edema.  Abdomen is soft nontender with positive bowel sounds.  Musculoskeletal does have movement of her upper extremities it appears at baseline she does have extensive lower extremity weakness   Neurologic appears grossly intact cranial nerves intact she has lower extremity weakness especially on the right  Psych she is pleasant and appropriate alert and oriented.  Labs.  December 27, 2018.  ESR was 109.  December 24, 2018.  Sodium 138 potassium 4.9 BUN 6.4 creatinine 0.33.  Albumin 2.5.  Otherwise liver function tests within normal limits.  WBC 9.3 hemoglobin 7.7 platelets 705.  Pre   Albumin was 10.   Assessment and plan.  1.  History of polymicrobial infection with complications as noted above-she continued on Cipro Flagyl and ampicillin through November 3 appears to be tolerating this well-she is afebrile-serial labs are followed by infectious disease at [facility].  At this point will continue to monitor.  2.  History of protein calorie malnutrition she states she is eating a bit better she is on Remeron and supplements weight is stable per nursing this will have to be monitored-.  3.  History of anemia hemoglobin appears to run mainly in the sevens to eights-this is monitored as well serially she is on iron twice daily at this point will monitor.  [id number]

## 2018-12-29 ENCOUNTER — Encounter: Payer: Self-pay | Admitting: Internal Medicine

## 2018-12-31 DIAGNOSIS — F17210 Nicotine dependence, cigarettes, uncomplicated: Secondary | ICD-10-CM | POA: Diagnosis not present

## 2018-12-31 DIAGNOSIS — L89154 Pressure ulcer of sacral region, stage 4: Secondary | ICD-10-CM | POA: Diagnosis not present

## 2018-12-31 DIAGNOSIS — E43 Unspecified severe protein-calorie malnutrition: Secondary | ICD-10-CM | POA: Diagnosis not present

## 2018-12-31 DIAGNOSIS — Z981 Arthrodesis status: Secondary | ICD-10-CM | POA: Diagnosis not present

## 2018-12-31 DIAGNOSIS — D649 Anemia, unspecified: Secondary | ICD-10-CM | POA: Diagnosis not present

## 2018-12-31 LAB — CBC AND DIFFERENTIAL
HCT: 25 — AB (ref 36–46)
Hemoglobin: 8.3 — AB (ref 12.0–16.0)
Neutrophils Absolute: 5
Platelets: 638 — AB (ref 150–399)
WBC: 9.9

## 2018-12-31 LAB — BASIC METABOLIC PANEL
BUN: 10 (ref 4–21)
Creatinine: 0.3 — AB (ref 0.5–1.1)
Glucose: 75
Potassium: 4.2 (ref 3.4–5.3)
Sodium: 140 (ref 137–147)

## 2018-12-31 LAB — HEPATIC FUNCTION PANEL
ALT: 6 — AB (ref 7–35)
AST: 20 (ref 13–35)
Alkaline Phosphatase: 64 (ref 25–125)
Bilirubin, Total: 0.2

## 2019-01-02 DIAGNOSIS — Z7401 Bed confinement status: Secondary | ICD-10-CM | POA: Diagnosis not present

## 2019-01-02 DIAGNOSIS — M2550 Pain in unspecified joint: Secondary | ICD-10-CM | POA: Diagnosis not present

## 2019-01-02 DIAGNOSIS — I959 Hypotension, unspecified: Secondary | ICD-10-CM | POA: Diagnosis not present

## 2019-01-08 DIAGNOSIS — E43 Unspecified severe protein-calorie malnutrition: Secondary | ICD-10-CM | POA: Diagnosis not present

## 2019-01-08 DIAGNOSIS — M2550 Pain in unspecified joint: Secondary | ICD-10-CM | POA: Diagnosis not present

## 2019-01-08 DIAGNOSIS — D649 Anemia, unspecified: Secondary | ICD-10-CM | POA: Diagnosis not present

## 2019-01-08 DIAGNOSIS — E559 Vitamin D deficiency, unspecified: Secondary | ICD-10-CM | POA: Diagnosis not present

## 2019-01-08 LAB — HEPATIC FUNCTION PANEL
ALT: 6 — AB (ref 7–35)
AST: 18 (ref 13–35)
Alkaline Phosphatase: 51 (ref 25–125)
Bilirubin, Total: 0.2

## 2019-01-08 LAB — CBC AND DIFFERENTIAL
HCT: 25 — AB (ref 36–46)
Hemoglobin: 8.4 — AB (ref 12.0–16.0)
Neutrophils Absolute: 5
Platelets: 434 — AB (ref 150–399)
WBC: 9.6

## 2019-01-08 LAB — BASIC METABOLIC PANEL
BUN: 10 (ref 4–21)
Creatinine: 0.4 — AB (ref 0.5–1.1)
Glucose: 82
Potassium: 4.5 (ref 3.4–5.3)
Sodium: 137 (ref 137–147)

## 2019-01-08 LAB — POCT ERYTHROCYTE SEDIMENTATION RATE, NON-AUTOMATED: Sed Rate: 120

## 2019-01-11 ENCOUNTER — Encounter: Payer: Self-pay | Admitting: Internal Medicine

## 2019-01-11 ENCOUNTER — Non-Acute Institutional Stay (SKILLED_NURSING_FACILITY): Payer: PPO | Admitting: Internal Medicine

## 2019-01-11 DIAGNOSIS — B373 Candidiasis of vulva and vagina: Secondary | ICD-10-CM | POA: Diagnosis not present

## 2019-01-11 DIAGNOSIS — B3731 Acute candidiasis of vulva and vagina: Secondary | ICD-10-CM

## 2019-01-11 NOTE — Progress Notes (Signed)
Location:  [facility] [place] Room Number: 114-P Place of Service:  SNF ([id number])  [name], MD  Patient Care Team: [name], MD as PCP - General (Internal Medicine)  Extended Emergency Contact Information Primary Emergency Contact: Alycia Rossetti Mobile Phone: [phone] Relation: Aunt    Allergies: Patient has no known allergies.  Chief Complaint  Patient presents with  . Acute Visit    Patient is seen for perineal yeast infection and leg pain.    HPI: Patient is a 73 y.o. female who the wound care nurse asked me to see for yeast in the perineum and gluteal fold.  Described as itchy.  Past Medical History:  Diagnosis Date  . Alcohol abuse   . Demyelinating disorder ([place])   . Tobacco abuse     No past surgical history on file.  Allergies as of 01/11/2019   No Known Allergies     Medication List       Accurate as of January 11, 2019 10:06 PM. If you have any questions, ask your nurse or doctor.        STOP taking these medications   nicotine 21 mg/24hr patch Commonly known as: NICODERM CQ - dosed in mg/24 hours Stopped by: Inocencio Homes, MD   sodium chloride 0.9 % SOLN 100 mL with ampicillin 2 g SOLR 2 g Stopped by: Inocencio Homes, MD     TAKE these medications   acetaminophen 500 MG tablet Commonly known as: TYLENOL Take 1,000 mg by mouth every 8 (eight) hours as needed for mild pain or headache.   ampicillin 2 g injection Commonly known as: OMNIPEN Inject 2 g into the vein every 4 (four) hours.   calcium carbonate 500 MG chewable tablet Commonly known as: TUMS - dosed in mg elemental calcium Chew 1 tablet by mouth daily.   ciprofloxacin 750 MG tablet Commonly known as: CIPRO Take 750 mg by mouth 2 (two) times daily.   D3-1000 25 MCG (1000 UT) capsule Generic drug: Cholecalciferol Take 1,000 Units by mouth daily.   ENSURE ENLIVE PO Take 237 mLs by mouth 3 (three) times daily.   feeding supplement  (PRO-STAT SUGAR FREE 64) Liqd Take 30 mLs by mouth 2 (two) times daily.   fluconazole 100 MG tablet Commonly known as: DIFLUCAN Take 100 mg by mouth daily. Take for yeast infection to perineal area, groin, buttocks, and back [id number] days   folic acid 1 MG tablet Commonly known as: FOLVITE Take 1 mg by mouth daily.   iron polysaccharides 150 MG capsule Commonly known as: NIFEREX Take 150 mg by mouth 2 (two) times daily.   metroNIDAZOLE 500 MG tablet Commonly known as: FLAGYL Take 500 mg by mouth 3 (three) times daily.   mirtazapine 30 MG tablet Commonly known as: REMERON Take 30 mg by mouth at bedtime.   multivitamin with minerals tablet Take 1 tablet by mouth daily.   polyethylene glycol 17 g packet Commonly known as: MIRALAX / GLYCOLAX Take 17 g by mouth 2 (two) times daily.   senna-docusate 8.6-50 MG tablet Commonly known as: Senokot-S Take 2 tablets by mouth 2 (two) times daily.   thiamine 100 MG tablet Take 100 mg by mouth daily.   vitamin B-12 1000 MCG tablet Commonly known as: CYANOCOBALAMIN Take 1,000 mcg by mouth daily.   vitamin C 500 MG tablet Commonly known as: ASCORBIC ACID Take 500 mg by mouth 3 (three) times daily. With meals  No orders of the defined types were placed in this encounter.    There is no immunization history on file for this patient.  Social History   Tobacco Use  . Smoking status: Current Every Day Smoker    Packs/day: 0.50    Types: Cigarettes  . Smokeless tobacco: Never Used  Substance Use Topics  . Alcohol use: Never    Frequency: Never    Review of Systems  DATA OBTAINED: from nurse GENERAL:  no fevers, fatigue, appetite changes SKIN: Itchy rash as per history present illness HEENT: No complaint RESPIRATORY: No cough, wheezing, SOB CARDIAC: No chest pain, palpitations, lower extremity edema  GI: No abdominal pain, No N/V/D or constipation, No heartburn or reflux  GU: No dysuria, frequency or urgency, or  incontinence  MUSCULOSKELETAL: No unrelieved bone/joint pain NEUROLOGIC: No headache, dizziness  PSYCHIATRIC: No overt anxiety or sadness  Vitals:   01/11/19 1545  BP: 100/62  Pulse: 78  Resp: 18  Temp: (!) 97.4 F (36.3 C)  SpO2: 100%   Body mass index is 16.88 kg/m. Physical Exam  GENERAL APPEARANCE: Alert, conversant, No acute distress  SKIN: Pink red papules and plaques all throughout perineum and onto buttocks in the gluteal fold part way up the back HEENT: Unremarkable RESPIRATORY: Breathing is even, unlabored. Lung sounds are clear   CARDIOVASCULAR: Heart RRR no murmurs, rubs or gallops. No peripheral edema  GASTROINTESTINAL: Abdomen is soft, non-tender, not distended w/ normal bowel sounds.  GENITOURINARY: Bladder non tender, not distended  MUSCULOSKELETAL: No abnormal joints or musculature NEUROLOGIC: Cranial nerves 2-12 grossly intact. Moves all extremities PSYCHIATRIC: Mood and affect appropriate to situation, no behavioral issues  Patient Active Problem List   Diagnosis Date Noted  . Osteomyelitis of lumbar spine ([place]) 12/24/2018  . Sacral decubitus ulcer, stage IV ([place]) 12/24/2018  . Severe protein-calorie malnutrition [place]: less than 60% of standard weight) ([place]) 12/24/2018  . Normocytic anemia 12/24/2018  . Vitamin D deficiency 12/24/2018  . Polymicrobial bacterial infection 12/21/2018  . Demyelinating disease of central nervous system ([place]) 12/02/2018  . Pressure ulcer, stage II ([place]) 12/02/2018  . Lumbar stenosis 11/24/2018  . Cervical stenosis of spine 11/24/2018  . Urinary retention 11/24/2018  . Tobacco abuse 11/24/2018  . Alcohol abuse 11/24/2018  . Hypokalemia 11/24/2018  . Hypomagnesemia 11/24/2018  . Hyponatremia 11/24/2018    CMP     Component Value Date/Time   NA 136 (A) 11/26/2018   K 3.8 11/26/2018   BUN 8 11/26/2018   CREATININE 0.3 (A) 11/26/2018   AST 17 11/22/2018   ALT 8 11/22/2018   ALKPHOS 41 11/07/2018   Recent Labs     11/22/18 11/26/18  NA 135* 136*  K 4.0 3.8  BUN 9 8  CREATININE 0.4* 0.3*   Recent Labs    11/07/18 11/22/18  AST 17  17  17 17   ALT 8  8  8 8   ALKPHOS 41  --    Recent Labs    11/22/18 11/26/18  WBC 8.8 10.8  NEUTROABS  --  7  HGB 7.6* 8.0*  HCT 22* 23*  PLT 376 561*   No results for input(s): CHOL, LDLCALC, TRIG in the last 8760 hours.  Invalid input(s): HCL No results found for: MICROALBUR No results found for: TSH No results found for: HGBA1C No results found for: CHOL, HDL, LDLCALC, LDLDIRECT, TRIG, CHOLHDL  Significant Diagnostic Results in last 30 days:  No results found.  Assessment and Plan  Perineal yeast/buttocks yeast-extensive yeast  infection, will treat with Diflucan 100 mg daily for 10 days    [name], MD

## 2019-01-14 ENCOUNTER — Encounter: Payer: Self-pay | Admitting: Internal Medicine

## 2019-01-14 ENCOUNTER — Non-Acute Institutional Stay (SKILLED_NURSING_FACILITY): Payer: PPO | Admitting: Internal Medicine

## 2019-01-14 DIAGNOSIS — M4626 Osteomyelitis of vertebra, lumbar region: Secondary | ICD-10-CM

## 2019-01-14 DIAGNOSIS — M2550 Pain in unspecified joint: Secondary | ICD-10-CM | POA: Diagnosis not present

## 2019-01-14 DIAGNOSIS — E46 Unspecified protein-calorie malnutrition: Secondary | ICD-10-CM

## 2019-01-14 DIAGNOSIS — D649 Anemia, unspecified: Secondary | ICD-10-CM | POA: Diagnosis not present

## 2019-01-14 DIAGNOSIS — F1010 Alcohol abuse, uncomplicated: Secondary | ICD-10-CM | POA: Diagnosis not present

## 2019-01-14 DIAGNOSIS — R339 Retention of urine, unspecified: Secondary | ICD-10-CM

## 2019-01-14 DIAGNOSIS — A499 Bacterial infection, unspecified: Secondary | ICD-10-CM | POA: Diagnosis not present

## 2019-01-14 DIAGNOSIS — L89154 Pressure ulcer of sacral region, stage 4: Secondary | ICD-10-CM | POA: Diagnosis not present

## 2019-01-14 DIAGNOSIS — E43 Unspecified severe protein-calorie malnutrition: Secondary | ICD-10-CM | POA: Diagnosis not present

## 2019-01-14 LAB — CBC AND DIFFERENTIAL
HCT: 27 — AB (ref 36–46)
Hemoglobin: 8.8 — AB (ref 12.0–16.0)
Neutrophils Absolute: 4
Platelets: 586 — AB (ref 150–399)
WBC: 9.3

## 2019-01-14 LAB — BASIC METABOLIC PANEL
BUN: 6 (ref 4–21)
CO2: 28 — AB (ref 13–22)
Chloride: 105 (ref 99–108)
Creatinine: 0.3 — AB (ref 0.5–1.1)
Glucose: 89
Potassium: 4.8 (ref 3.4–5.3)
Sodium: 141 (ref 137–147)

## 2019-01-14 LAB — COMPREHENSIVE METABOLIC PANEL
Albumin: 2.7 — AB (ref 3.5–5.0)
Calcium: 8.8 (ref 8.7–10.7)
GFR calc Af Amer: 90
GFR calc non Af Amer: 90
Globulin: 2.8

## 2019-01-14 LAB — POCT ERYTHROCYTE SEDIMENTATION RATE, NON-AUTOMATED: Sed Rate: 129

## 2019-01-14 LAB — HEPATIC FUNCTION PANEL
ALT: 6 — AB (ref 7–35)
AST: 17 (ref 13–35)
Alkaline Phosphatase: 48 (ref 25–125)
Bilirubin, Total: 0.2

## 2019-01-14 LAB — CBC: RBC: 3.02 — AB (ref 3.87–5.11)

## 2019-01-14 NOTE — Progress Notes (Signed)
Location:  [place] Room Number: 114-P Place of Service:  SNF [phone]) Provider:  Granville Lewis, PA-C  [name], MD  Patient Care Team: [name], MD as PCP - General (Internal Medicine)  Extended Emergency Contact Information Primary Emergency Contact: Alycia Rossetti Mobile Phone: [phone] Relation: Aunt  Code Status:  DNR Goals of care: Advanced Directive information Advanced Directives 01/14/2019  Does Patient Have a Medical Advance Directive? Yes  Type of Advance Directive Out of facility DNR (pink MOST or yellow form)  Does patient want to make changes to medical advance directive? No - Patient declined  Pre-existing out of facility DNR order (yellow form or pink MOST form) Yellow form placed in chart (order not valid for inpatient use)     Chief Complaint  Patient presents with  . Medical Management of Chronic Issues    Routine [facility] visit  Routine visit for medical management of chronic medical conditions including polymicrobial infection of the lumbar spine with osteomyelitis-history lumbar laminectomy as well as cervical laminectomy.  As well as protein calorie malnutrition-anemia-alcohol abuse-vitamin D deficiency-history urinary retention.    HPI:  Pt is a 73 y.o. female seen today for medical management of chronic diseases.   As noted above.  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  She had a subsequent infection and reirrigation of the wound with a wound VAC.  Cultures grew out E. coli Proteus and Enterococcus as well as Morganella and she was started on IV ampicillin and Cipro until November 3 later bacteroids grew out  out of the drain and she was also started on Flagyl she will be on all these until November 3.  She is followed by infectious disease who has ordered weekly labs   She also has a history of protein calorie malnutrition she is on Remeron and supplements  In regards to anemia she is on iron hemoglobin actually has shown some improvement at 8.4 again these labs are done weekly update lab is pending.  She also has a stage IV sacral ulcer she has been advised to stay off her back she is followed by wound care and was debrided apparently during her hospitalization.  She also continues on vitamin B12 and thiamine with a history of alcohol abuse in the past she has not had any symptoms of withdrawal symptoms during her stay here.  She also has a Foley catheter -at this point she says she has been tolerating this fairly well.  She also is in the course of Diflucan secondary to a suspected yeast rash  Currently she is resting in bed comfortably she is actually just returned from her visit to [facility] -update labs are pending      Past Medical History:  Diagnosis Date  . Alcohol abuse   . Demyelinating disorder ([place])   . Tobacco abuse    No past surgical history on file.  No Known Allergies  Outpatient Encounter Medications as of 01/14/2019  Medication Sig  . acetaminophen (TYLENOL) 500 MG tablet Take 1,000 mg by mouth every 8 (eight) hours as needed for mild pain or headache.   . Amino Acids-Protein Hydrolys (  FEEDING SUPPLEMENT, PRO-STAT SUGAR FREE 64,) LIQD Take 30 mLs by mouth 2 (two) times daily.  Marland Kitchen ampicillin (OMNIPEN) 2 g injection Inject 2 g into the vein every 4 (four) hours.  . calcium carbonate (TUMS - DOSED IN MG ELEMENTAL CALCIUM) 500 MG chewable tablet Chew 1 tablet by mouth daily.  . Cholecalciferol (D3-1000) 25 MCG (1000 UT) capsule Take 1,000 Units by mouth daily.  . ciprofloxacin (CIPRO) 750 MG tablet Take 750 mg by mouth 2 (two) times daily.  .  fluconazole (DIFLUCAN) 100 MG tablet Take 100 mg by mouth daily. Take for yeast infection to perineal area, groin, buttocks, and back x10 days  . folic acid (FOLVITE) 1 MG tablet Take 1 mg by mouth daily.  . iron polysaccharides (NIFEREX) 150 MG capsule Take 150 mg by mouth 2 (two) times daily.  . metroNIDAZOLE (FLAGYL) 500 MG tablet Take 500 mg by mouth 3 (three) times daily.  . mirtazapine (REMERON) 30 MG tablet Take 30 mg by mouth at bedtime.  . Multiple Vitamins-Minerals (MULTIVITAMIN WITH MINERALS) tablet Take 1 tablet by mouth daily.  . Nutritional Supplements (ENSURE ENLIVE PO) Take 237 mLs by mouth 3 (three) times daily.  Marland Kitchen senna-docusate (SENOKOT-S) 8.6-50 MG tablet Take 2 tablets by mouth 2 (two) times daily.  Marland Kitchen thiamine 100 MG tablet Take 100 mg by mouth daily.  . vitamin B-12 (CYANOCOBALAMIN) 1000 MCG tablet Take 1,000 mcg by mouth daily.  . vitamin C (ASCORBIC ACID) 500 MG tablet Take 500 mg by mouth 3 (three) times daily. With meals   No facility-administered encounter medications on file as of 01/14/2019.     Review of Systems   General she is not complaining of any fever or chills.  Skin she does have a rash being treated with Diflucan this is more so on her back it appears.  Head ears eyes nose mouth and throat does not complain of visual changes sore throat or difficulty swallowing.  Respiratory is not complaining of being short of breath or having a cough.  Cardiac does not complain of chest pain she does not have significant lower extremity edema.  GI is not complaining of abdominal discomfort nausea vomiting diarrhea constipation still states she has a somewhat poor appetite.  GU does have an indwelling Foley catheter she does not complain of dysuria.  Musculoskeletal at this point is not complaining of pain-she does have significant weakness and frailty more so of lower extremities.  Neurologic positive for weakness she is not complaining of headache dizziness  --has had numbness but she says this is gradually improving  Psych does not complain of being depressed or anxious at this time she does have history of alcohol abuse.     n  There is no immunization history on file for this patient. Pertinent  Health Maintenance Due  Topic Date Due  . MAMMOGRAM  07/29/1996  . COLONOSCOPY  07/29/1996  . DEXA SCAN  07/30/2011  . PNA vac Low Risk Adult (1 of 2 - PCV13) 07/30/2011  . INFLUENZA VACCINE  10/27/2018   No flowsheet data found. Functional Status Survey:    Vitals:   01/14/19 1441  BP: 110/65  Pulse: 64  Resp: 18  Temp: 98.1 F (36.7 C)  TempSrc: Oral  Weight: 111 lb 1.6 oz (50.4 kg)  Height: 5\' 8"  (1.727 m)   Body mass index is 16.89 kg/m. Physical Exam   In general this is a pleasant elderly female in no distress lying comfortably in bed.  Her  skin is warm and dry she does have somewhat of a yeasty erythematous rash t most prominent in her perineal area.  And back  Sacral wound is currently covered was not visualized secondary to patient positioning has been followed by wound care  Eyes visual acuity appears to be grossly intact  Oropharynx is clear mucous membranes moist.  Neck-she has a cervical collar in place  Chest is clear to auscultation there is no labored breathing  Heart is regular rate and rhythm without murmur gallop or rub she does not have significant edema.  Abdomen is soft nontender with positive bowel sounds.  GU she does have an indwelling Foley catheter draining amber-colored urine  Musculoskeletal moves her upper extremities at baseline with general frailty she has extensive weakness of her lower extremities limited exam since she is in bed.  Neurologic as noted above it is positive for weakness especially lower extremities cranial nerves appear to be intact her speech is clear.  Psych she is alert and oriented very pleasant and appropriate   Labs.  January 08, 2019.  Sodium 137  potassium 4.5 BUN 9.6 creatinine 0.39.  WBC 9.6 hemoglobin 8.4 platelets 434   Albumin 2.6 otherwise liver function tests within normal limits.  Assessment and plan.  1.  History of polymicrobial infection with complications-she continues on Cipro Flagyl and ampicillin through November 3-she appears to be tolerating this well her white count has been within normal limits she is followed closely by infectious disease and actually saw them earlier today.  At this point continue to monitor.  2.  History of anemia hemoglobin appears actually to be slowly improving it was 8.4 on most recent lab and update lab is pending that was drawn apparently earlier today-she is on iron-labs are followed as well by infectious disease at [facility].  3.  Protein calorie malnutrition appetite continues to be somewhat poor she states she is on Remeron for appetite stimulation as well as numerous supplements-.  She has gained a small amount of weight during her stay here  #4-history of stage IV decubitus ulcer this was debrided by plastic surgery-she is receiving wound care at facility.  5.  History of tobacco abuse she continues on B12 folate and thiamine.  6.  Weakness and debility-she will continue to need significant assistance-she does have Tylenol as needed for pain-currently she is not complaining of discomfort but it appears she probably has an fairly extensive rehab course ahead of her--she has stated she is very determined to be able to ambulate again  #7 rash-she is on Diflucan 100 mg a day for the next 7 days to complete a 10-day course-at this point continue to monitor it appears to not be causing significant discomfort.     Additional  Labs reviewed: Recent Labs    11/22/18 11/26/18  NA 135* 136*  K 4.0 3.8  BUN 9 8  CREATININE 0.4* 0.3*   Recent Labs    11/07/18 11/22/18  AST 17  17  17 17   ALT 8  8  8 8   ALKPHOS 41  --    Recent Labs    11/22/18  11/26/18  WBC 8.8 10.8  NEUTROABS  --  7  HGB 7.6* 8.0*  HCT 22* 23*  PLT 376 561*   No results found for: TSH No results found for: HGBA1C No results found for: CHOL, HDL, LDLCALC, LDLDIRECT, TRIG, CHOLHDL  Significant Diagnostic Results in last 30 days:  No results found.  [id number]

## 2019-01-14 NOTE — Progress Notes (Signed)
This encounter was created in error - please disregard.

## 2019-01-14 NOTE — Progress Notes (Deleted)
Location:  [place] Room Number: 114-P Place of Service:  SNF [phone]) Provider:  ***  [name], MD  Patient Care Team: [name], MD as PCP - General (Internal Medicine)  Extended Emergency Contact Information Primary Emergency Contact: Alycia Rossetti Mobile Phone: [phone] Relation: Aunt  Code Status:  *** Goals of care: Advanced Directive information Advanced Directives 01/11/2019  Does Patient Have a Medical Advance Directive? Yes  Type of Advance Directive Out of facility DNR (pink MOST or yellow form)  Does patient want to make changes to medical advance directive? No - Patient declined  Pre-existing out of facility DNR order (yellow form or pink MOST form) Yellow form placed in chart (order not valid for inpatient use)     Chief Complaint  Patient presents with  . Medical Management of Chronic Issues    Routine [facility] visit    HPI:  Pt is a 73 y.o. female seen today for medical management of chronic diseases.     Past Medical History:  Diagnosis Date  . Alcohol abuse   . Demyelinating disorder ([place])   . Tobacco abuse    History reviewed. No pertinent surgical history.  No Known Allergies  Outpatient Encounter Medications as of 01/14/2019  Medication Sig  . acetaminophen (TYLENOL) 500 MG tablet Take 1,000 mg by mouth every 8 (eight) hours as needed for mild pain or headache.   . Amino Acids-Protein Hydrolys (FEEDING SUPPLEMENT, PRO-STAT SUGAR FREE 64,) LIQD Take 30 mLs by mouth 2 (two) times daily.  Marland Kitchen ampicillin (OMNIPEN) 2 g injection Inject 2 g into the vein every 4 (four) hours.  . calcium carbonate (TUMS - DOSED IN MG ELEMENTAL CALCIUM) 500 MG chewable tablet Chew 1 tablet by mouth daily.  . Cholecalciferol (D3-1000) 25 MCG (1000 UT) capsule Take 1,000 Units by mouth daily.  . ciprofloxacin (CIPRO) 750 MG tablet Take 750 mg by mouth 2 (two) times daily.  . fluconazole (DIFLUCAN) 100 MG tablet Take  100 mg by mouth daily. Take for yeast infection to perineal area, groin, buttocks, and back x10 days  . folic acid (FOLVITE) 1 MG tablet Take 1 mg by mouth daily.  . iron polysaccharides (NIFEREX) 150 MG capsule Take 150 mg by mouth 2 (two) times daily.  . metroNIDAZOLE (FLAGYL) 500 MG tablet Take 500 mg by mouth 3 (three) times daily.  . mirtazapine (REMERON) 30 MG tablet Take 30 mg by mouth at bedtime.  . Multiple Vitamins-Minerals (MULTIVITAMIN WITH MINERALS) tablet Take 1 tablet by mouth daily.  . Nutritional Supplements (ENSURE ENLIVE PO) Take 237 mLs by mouth 3 (three) times daily.  Marland Kitchen senna-docusate (SENOKOT-S) 8.6-50 MG tablet Take 2 tablets by mouth 2 (two) times daily.  Marland Kitchen thiamine 100 MG tablet Take 100 mg by mouth daily.  . vitamin B-12 (CYANOCOBALAMIN) 1000 MCG tablet Take 1,000 mcg by mouth daily.  . vitamin C (ASCORBIC ACID) 500 MG tablet Take 500 mg by mouth 3 (three) times daily. With meals  . [DISCONTINUED] polyethylene glycol (MIRALAX / GLYCOLAX) 17 g packet Take 17 g by mouth 2 (two) times daily.   No facility-administered encounter medications on file as of 01/14/2019.     Review of Systems   There is no immunization history on file for this patient. Pertinent  Health Maintenance Due  Topic Date Due  . MAMMOGRAM  07/29/1996  . COLONOSCOPY  07/29/1996  . DEXA SCAN  07/30/2011  . PNA vac Low Risk Adult (1  of 2 - PCV13) 07/30/2011  . INFLUENZA VACCINE  10/27/2018   No flowsheet data found. Functional Status Survey:    Vitals:   01/14/19 1024  BP: 110/65  Pulse: 64  Resp: 18  Temp: 98.1 F (36.7 C)  TempSrc: Oral  Weight: 111 lb 1.6 oz (50.4 kg)  Height: 5\' 8"  (1.727 m)   Body mass index is 16.89 kg/m. Physical Exam  Labs reviewed: Recent Labs    11/22/18 11/26/18  NA 135* 136*  K 4.0 3.8  BUN 9 8  CREATININE 0.4* 0.3*   Recent Labs    11/07/18 11/22/18  AST 17  17  17 17   ALT 8  8  8 8   ALKPHOS 41  --    Recent Labs    11/22/18  11/26/18  WBC 8.8 10.8  NEUTROABS  --  7  HGB 7.6* 8.0*  HCT 22* 23*  PLT 376 561*   No results found for: TSH No results found for: HGBA1C No results found for: CHOL, HDL, LDLCALC, LDLDIRECT, TRIG, CHOLHDL  Significant Diagnostic Results in last 30 days:  No results found.  Assessment/Plan There are no diagnoses linked to this encounter.   Family/ staff Communication: ***  Labs/tests ordered:  ***

## 2019-01-15 ENCOUNTER — Non-Acute Institutional Stay (SKILLED_NURSING_FACILITY): Payer: PPO | Admitting: Internal Medicine

## 2019-01-15 DIAGNOSIS — B372 Candidiasis of skin and nail: Secondary | ICD-10-CM | POA: Diagnosis not present

## 2019-01-20 ENCOUNTER — Encounter: Payer: Self-pay | Admitting: Internal Medicine

## 2019-01-21 ENCOUNTER — Encounter: Payer: Self-pay | Admitting: Internal Medicine

## 2019-01-21 DIAGNOSIS — D649 Anemia, unspecified: Secondary | ICD-10-CM | POA: Diagnosis not present

## 2019-01-21 DIAGNOSIS — I10 Essential (primary) hypertension: Secondary | ICD-10-CM | POA: Diagnosis not present

## 2019-01-21 DIAGNOSIS — M2550 Pain in unspecified joint: Secondary | ICD-10-CM | POA: Diagnosis not present

## 2019-01-21 DIAGNOSIS — E43 Unspecified severe protein-calorie malnutrition: Secondary | ICD-10-CM | POA: Diagnosis not present

## 2019-01-21 LAB — CBC AND DIFFERENTIAL
HCT: 26 — AB (ref 36–46)
Hemoglobin: 8.8 — AB (ref 12.0–16.0)
Neutrophils Absolute: 4
Platelets: 517 — AB (ref 150–399)
WBC: 9.4

## 2019-01-21 LAB — COMPREHENSIVE METABOLIC PANEL
Albumin: 2.5 — AB (ref 3.5–5.0)
Calcium: 8.4 — AB (ref 8.7–10.7)
Globulin: 2.7

## 2019-01-21 LAB — HEPATIC FUNCTION PANEL
ALT: 6 — AB (ref 7–35)
AST: 17 (ref 13–35)
Alkaline Phosphatase: 44 (ref 25–125)
Bilirubin, Total: 0.2

## 2019-01-21 LAB — BASIC METABOLIC PANEL
BUN: 7 (ref 4–21)
CO2: 27 — AB (ref 13–22)
Chloride: 102 (ref 99–108)
Creatinine: 0.3 — AB (ref 0.5–1.1)
Glucose: 88
Potassium: 4.3 (ref 3.4–5.3)
Sodium: 137 (ref 137–147)

## 2019-01-21 LAB — POCT ERYTHROCYTE SEDIMENTATION RATE, NON-AUTOMATED: Sed Rate: 100

## 2019-01-21 LAB — CBC: RBC: 2.94 — AB (ref 3.87–5.11)

## 2019-01-21 NOTE — Progress Notes (Signed)
Location:   [facility]    [name], MD  Patient Care Team: [name], MD as PCP - General (Internal Medicine)  Extended Emergency Contact Information Primary Emergency Contact: Alycia Rossetti Mobile Phone: [phone] Relation: Aunt    Allergies: Patient has no known allergies.  Chief Complaint  Patient presents with  . Acute Visit    HPI: Patient is 73 y.o. female who who is being seen in follow-up to last week, when patient was seen for extensive yeast in the perineum and lower back.  Per wound care nurse patient's perineal yeast has cleared up but the lower back has not.  Past Medical History:  Diagnosis Date  . Alcohol abuse   . Demyelinating disorder ([place])   . Tobacco abuse     History reviewed. No pertinent surgical history.  Allergies as of 01/15/2019   No Known Allergies     Medication List       Accurate as of January 15, 2019 11:59 PM. If you have any questions, ask your nurse or doctor.        acetaminophen 500 MG tablet Commonly known as: TYLENOL Take 1,000 mg by mouth every 8 (eight) hours as needed for mild pain or headache.   ampicillin 2 g injection Commonly known as: OMNIPEN Inject 2 g into the vein every 4 (four) hours.   calcium carbonate 500 MG chewable tablet Commonly known as: TUMS - dosed in mg elemental calcium Chew 1 tablet by mouth daily.   ciprofloxacin 750 MG tablet Commonly known as: CIPRO Take 750 mg by mouth 2 (two) times daily.   D3-1000 25 MCG (1000 UT) capsule Generic drug: Cholecalciferol Take 1,000 Units by mouth daily.   ENSURE ENLIVE PO Take 237 mLs by mouth 3 (three) times daily.   feeding supplement (PRO-STAT SUGAR FREE 64) Liqd Take 30 mLs by mouth 2 (two) times daily.   fluconazole 100 MG tablet Commonly known as: DIFLUCAN Take 100 mg by mouth daily. Take for yeast infection to perineal area, groin, buttocks, and back [id number] days   folic acid 1 MG tablet  Commonly known as: FOLVITE Take 1 mg by mouth daily.   iron polysaccharides 150 MG capsule Commonly known as: NIFEREX Take 150 mg by mouth 2 (two) times daily.   metroNIDAZOLE 500 MG tablet Commonly known as: FLAGYL Take 500 mg by mouth 3 (three) times daily.   mirtazapine 30 MG tablet Commonly known as: REMERON Take 30 mg by mouth at bedtime.   multivitamin with minerals tablet Take 1 tablet by mouth daily.   senna-docusate 8.6-50 MG tablet Commonly known as: Senokot-S Take 2 tablets by mouth 2 (two) times daily.   thiamine 100 MG tablet Take 100 mg by mouth daily.   vitamin B-12 1000 MCG tablet Commonly known as: CYANOCOBALAMIN Take 1,000 mcg by mouth daily.   vitamin C 500 MG tablet Commonly known as: ASCORBIC ACID Take 500 mg by mouth 3 (three) times daily. With meals       No orders of the defined types were placed in this encounter.    There is no immunization history on file for this patient.  Social History   Tobacco Use  . Smoking status: Current Every Day Smoker    Packs/day: 0.50    Types: Cigarettes  . Smokeless tobacco: Never Used  Substance Use Topics  . Alcohol use: Never    Frequency: Never    Review of Systems  DATA  OBTAINED: from patient, nurse, medical record, family member GENERAL:  no fevers, fatigue, appetite changes SKIN:  itching, rash lower back HEENT: No complaint RESPIRATORY: No cough, wheezing, SOB CARDIAC: No chest pain, palpitations, lower extremity edema  GI: No abdominal pain, No N/V/D or constipation, No heartburn or reflux  GU: No dysuria, frequency or urgency, or incontinence  MUSCULOSKELETAL: No unrelieved bone/joint pain NEUROLOGIC: No headache, dizziness  PSYCHIATRIC: No overt anxiety or sadness  Vitals:   01/21/19 2142  BP: 100/62  Pulse: 80  Resp: 18  Temp: 98 F (36.7 C)   There is no height or weight on file to calculate BMI. Physical Exam  GENERAL APPEARANCE: Alert, conversant, No acute distress   SKIN: Pink-red papules and plaques and superior gluteal foldThe back improved but still present HEENT: Unremarkable RESPIRATORY: Breathing is even, unlabored. Lung sounds are clear   CARDIOVASCULAR: Heart RRR no murmurs, rubs or gallops. No peripheral edema  GASTROINTESTINAL: Abdomen is soft, non-tender, not distended w/ normal bowel sounds.  GENITOURINARY: Bladder non tender, not distended  MUSCULOSKELETAL: No abnormal joints or musculature NEUROLOGIC: Cranial nerves 2-12 grossly intact. Moves all extremities PSYCHIATRIC: Mood and affect appropriate to situation, no behavioral issues  Patient Active Problem List   Diagnosis Date Noted  . Osteomyelitis of lumbar spine ([place]) 12/24/2018  . Sacral decubitus ulcer, stage IV ([place]) 12/24/2018  . Severe protein-calorie malnutrition Altamease Oiler: less than 60% of standard weight) ([place]) 12/24/2018  . Normocytic anemia 12/24/2018  . Vitamin D deficiency 12/24/2018  . Polymicrobial bacterial infection 12/21/2018  . Demyelinating disease of central nervous system ([place]) 12/02/2018  . Pressure ulcer, stage II ([place]) 12/02/2018  . Lumbar stenosis 11/24/2018  . Cervical stenosis of spine 11/24/2018  . Urinary retention 11/24/2018  . Tobacco abuse 11/24/2018  . Alcohol abuse 11/24/2018  . Hypokalemia 11/24/2018  . Hypomagnesemia 11/24/2018  . Hyponatremia 11/24/2018    CMP     Component Value Date/Time   NA 137 01/08/2019   K 4.5 01/08/2019   BUN 10 01/08/2019   CREATININE 0.4 (A) 01/08/2019   AST 18 01/08/2019   ALT 6 (A) 01/08/2019   ALKPHOS 51 01/08/2019   Recent Labs    12/24/18 12/31/18 01/08/19  NA 138 140 137  K 4.9 4.2 4.5  BUN 6 10 10   CREATININE 0.3* 0.3* 0.4*   Recent Labs    12/24/18 12/31/18 01/08/19  AST 25 20 18   ALT 6* 6* 6*  ALKPHOS 58 64 51   Recent Labs    12/24/18 12/31/18 01/08/19  WBC 9.3 9.9 9.6  NEUTROABS 5 5 5   HGB 7.7* 8.3* 8.4*  HCT 24* 25* 25*  PLT 705* 638* 434*   No results for input(s): CHOL,  LDLCALC, TRIG in the last 8760 hours.  Invalid input(s): HCL No results found for: MICROALBUR No results found for: TSH No results found for: HGBA1C No results found for: CHOL, HDL, LDLCALC, LDLDIRECT, TRIG, CHOLHDL  Significant Diagnostic Results in last 30 days:  No results found.  Assessment and Plan  Yeast to skin of the lower back-patient is still on Diflucan; will add Nizoral cream to area 3 times daily until resolution     [name], MD

## 2019-01-28 LAB — CBC AND DIFFERENTIAL
HCT: 23 — AB (ref 36–46)
Hemoglobin: 8 — AB (ref 12.0–16.0)
Neutrophils Absolute: 5
Platelets: 544 — AB (ref 150–399)
WBC: 9.9

## 2019-01-28 LAB — BASIC METABOLIC PANEL
BUN: 6 (ref 4–21)
CO2: 27 — AB (ref 13–22)
Chloride: 100 (ref 99–108)
Creatinine: 0.3 — AB (ref 0.5–1.1)
Glucose: 77
Potassium: 4.3 (ref 3.4–5.3)
Sodium: 137 (ref 137–147)

## 2019-01-28 LAB — COMPREHENSIVE METABOLIC PANEL
Albumin: 2.4 — AB (ref 3.5–5.0)
Calcium: 7.9 — AB (ref 8.7–10.7)
GFR calc Af Amer: 90
GFR calc non Af Amer: 90
Globulin: 2.8

## 2019-01-28 LAB — POCT ERYTHROCYTE SEDIMENTATION RATE, NON-AUTOMATED: Sed Rate: 130

## 2019-01-28 LAB — HEPATIC FUNCTION PANEL
ALT: 5 — AB (ref 7–35)
AST: 13 (ref 13–35)
Alkaline Phosphatase: 36 (ref 25–125)
Bilirubin, Total: 0.2

## 2019-01-28 LAB — CBC: RBC: 2.73 — AB (ref 3.87–5.11)

## 2019-01-30 ENCOUNTER — Non-Acute Institutional Stay (SKILLED_NURSING_FACILITY): Payer: PPO | Admitting: Internal Medicine

## 2019-01-30 DIAGNOSIS — E43 Unspecified severe protein-calorie malnutrition: Secondary | ICD-10-CM

## 2019-01-30 DIAGNOSIS — A499 Bacterial infection, unspecified: Secondary | ICD-10-CM | POA: Diagnosis not present

## 2019-01-30 DIAGNOSIS — D649 Anemia, unspecified: Secondary | ICD-10-CM

## 2019-01-30 NOTE — Progress Notes (Signed)
This is an acute visit.  Level care skilled.  [facility]  Chief complaint follow-up history of lumbar spine infection with osteomyelitis status post lumbar laminectomy as well as follow-up of anemia.  History of present illness.  Patient is a pleasant 73 year old female who is here for rehab after hospitalization for a polymicrobial infection of the lumbar spine with osteomyelitis--she had a lumbar laminectomy on L2-L5 on August 18 and a cervical laminectomy on August 25-she also had a wound repair on the lumbar spine along with a repair of a dural leak.  She had a subsequent infection and reirrigation of the wound with a wound VAC.  Cultures grew out E. coli Proteus and Enterococcus as well as Morganella and she was started on IV ampicillin and Cipro  --And completed those yesterday. She also completed a course of Flagyl secondary to growing out bacteroids out of her drain  She is followed closely by infectious disease who has ordered weekly labs which appear to show stability.  Currently she has no complaints she appears to be gradually getting a bit stronger she does have a poor appetite and has been started on Remeron apparently she has been doing a bit better per nursing she is on numerous supplements but this will have to be monitored.  She does have a history of anemia she is on iron hemoglobin has run in the sevens to eights most recent lab it was 8.0 this is followed with weekly labs.  She also has a stage IV sacral ulcer which is followed by wound care and was debrided previously during her hospitalization.  She also has been treated with Diflucan and Nizoral cream for a back and perineal rash which appears to have improved.  Past Medical History:  Diagnosis Date  . Alcohol abuse   . Demyelinating disorder ([place])   . Tobacco abuse    No past surgical history on file.  No Known Allergies    MEDICATIONS     Medication Sig  . acetaminophen  (TYLENOL) 500 MG tablet Take 1,000 mg by mouth every 8 (eight) hours as needed for mild pain or headache.   . Amino Acids-Protein Hydrolys (FEEDING SUPPLEMENT, PRO-STAT SUGAR FREE 64,) LIQD Take 30 mLs by mouth 2 (two) times daily.  .     . calcium carbonate (TUMS - DOSED IN MG ELEMENTAL CALCIUM) 500 MG chewable tablet Chew 1 tablet by mouth daily.  . Cholecalciferol (D3-1000) 25 MCG (1000 UT) capsule Take 1,000 Units by mouth daily.  .     .     . folic acid (FOLVITE) 1 MG tablet Take 1 mg by mouth daily.  . iron polysaccharides (NIFEREX) 150 MG capsule Take 150 mg by mouth 2 (two) times daily.  .     . mirtazapine (REMERON) 30 MG tablet Take 30 mg by mouth at bedtime.  . Multiple Vitamins-Minerals (MULTIVITAMIN WITH MINERALS) tablet Take 1 tablet by mouth daily.  . Nutritional Supplements (ENSURE ENLIVE PO) Take 237 mLs by mouth 3 (three) times daily.  Marland Kitchen senna-docusate (SENOKOT-S) 8.6-50 MG tablet Take 2 tablets by mouth 2 (two) times daily.  Marland Kitchen thiamine 100 MG tablet Take 100 mg by mouth daily.  . vitamin B-12 (CYANOCOBALAMIN) 1000 MCG tablet Take 1,000 mcg by mouth daily.  . vitamin C (ASCORBIC ACID) 500 MG tablet Take 500 mg by mouth 3 (three) times daily. With meals   No facility-administered encounter medications on file as of 01/14/2019.     Social History  Tobacco Use  . Smoking status: Current Every Day Smoker    Packs/day: 0.50    Types: Cigarettes  . Smokeless tobacco: Never Used  Substance Use Topics  . Alcohol use: Never    Frequency: Never     Review of systems.  General she is not complaining of any fever or chills she has completed her antibiotics.  Skin does not really complain of itching she did have a rash as noted above which appears to be resolving.  Head ears eyes nose mouth and throat is not complaining of visual changes or sore throat.  Respiratory does not complain of being short of breath or having a cough.  Cardiac does not  complain of chest pain or edema.  GI is not complaining of abdominal discomfort nausea vomiting diarrhea constipation has somewhat of a poor appetite but she says she is trying to eat better and apparently has had some improvement.  GU does not complain of dysuria does have a history of a catheter.  Musculoskeletal has significant lower extremity weakness does not really complain of pain.  Neurologic again positive for significant lower extremity weakness does not complain of dizziness or headache or syncope.  And psych does have a history of tobacco and alcohol abuse but does not complain of overt depression or anxiety.  Physical exam.  Temperature is 97.5 pulse 80 respirations 18 blood pressure 110/60.  In general this is a pleasant frail elderly female in no distress.  Her skin is warm and dry she does have history of perineal and back rash which appears to be improving.  She also has a history of a sacral ulcer that was not visualized secondary to her positioning in bed  Eyes visual acuity appears intact.  Oropharynx appears to be clear mucous membranes moist.  Chest is clear to auscultation with somewhat shallow air entry.  Heart is regular rate and rhythm she does not have significant lower extremity edema.  Abdomen is soft nontender with positive bowel sounds.  Musculoskeletal moves her upper extremities at baseline PICC line is in place right upper arm-she does have significant weakness of her lower extremities holds them in a somewhat contracted position.  Neurologic as noted above her speech is clear upper extremity strength appears to be intact has significant weakness of her lower extremities.  Psych she is pleasant and appropriate alert and oriented   Labs.  January 28, 2019.  WBC 9.3 hemoglobin 8.0 platelets 544.  Sodium 137 potassium 4.3 BUN 6.3 creatinine 0.26.  Albumin was 2.4.  Sed rate was 130.  Assessment plan.  1.  History of polymicrobial  infection with complications she has completed Cipro Flagyl and ampicillin as of yesterday-her white count continues to be normal she is followed by infectious disease-at this point will monitor for any reoccurrence but she appears to be stable in this regards again will defer to infectious disease about any reinitiation of antibiotics or therapy.  2.  History of anemia hemoglobin appears to hover around the 8 range on average-it is down slightly from last week when it was 8.4 currently 8.0.  She is on iron-will await updated labs next Monday she is followed by infectious disease which are following these labs as well.  3.  Protein calorie malnutrition appetite is still somewhat poor but she is on numerous supplementations and on Remeron-at this point encourage p.o. intake which I did discuss with her again today and she says she is trying harder and thinks she is doing somewhat  better.--Albumin is 2.4  Per nursing her weight has been stable.  [id number]

## 2019-02-01 ENCOUNTER — Encounter: Payer: Self-pay | Admitting: Internal Medicine

## 2019-02-06 LAB — CBC: RBC: 2.84 — AB (ref 3.87–5.11)

## 2019-02-06 LAB — COMPREHENSIVE METABOLIC PANEL
Albumin: 2.6 — AB (ref 3.5–5.0)
Calcium: 8.3 — AB (ref 8.7–10.7)
GFR calc Af Amer: 90
GFR calc non Af Amer: 90
Globulin: 3

## 2019-02-06 LAB — BASIC METABOLIC PANEL
BUN: 7 (ref 4–21)
CO2: 28 — AB (ref 13–22)
Chloride: 100 (ref 99–108)
Creatinine: 0.2 — AB (ref 0.5–1.1)
Glucose: 84
Potassium: 3.9 (ref 3.4–5.3)
Sodium: 138 (ref 137–147)

## 2019-02-06 LAB — CBC AND DIFFERENTIAL
HCT: 24 — AB (ref 36–46)
Hemoglobin: 7.9 — AB (ref 12.0–16.0)
Neutrophils Absolute: 4
Platelets: 628 — AB (ref 150–399)
WBC: 7.7

## 2019-02-06 LAB — HEPATIC FUNCTION PANEL
ALT: 7 (ref 7–35)
AST: 22 (ref 13–35)
Alkaline Phosphatase: 41 (ref 25–125)
Bilirubin, Total: 0.2

## 2019-02-11 ENCOUNTER — Encounter: Payer: Self-pay | Admitting: Internal Medicine

## 2019-02-11 ENCOUNTER — Non-Acute Institutional Stay (SKILLED_NURSING_FACILITY): Payer: PPO | Admitting: Internal Medicine

## 2019-02-11 DIAGNOSIS — G379 Demyelinating disease of central nervous system, unspecified: Secondary | ICD-10-CM

## 2019-02-11 DIAGNOSIS — A499 Bacterial infection, unspecified: Secondary | ICD-10-CM | POA: Diagnosis not present

## 2019-02-11 DIAGNOSIS — L89154 Pressure ulcer of sacral region, stage 4: Secondary | ICD-10-CM | POA: Diagnosis not present

## 2019-02-11 DIAGNOSIS — E43 Unspecified severe protein-calorie malnutrition: Secondary | ICD-10-CM | POA: Diagnosis not present

## 2019-02-11 LAB — HEPATIC FUNCTION PANEL
ALT: 7 (ref 7–35)
AST: 16 (ref 13–35)
Alkaline Phosphatase: 51 (ref 25–125)
Bilirubin, Total: 0.2

## 2019-02-11 LAB — CBC AND DIFFERENTIAL
HCT: 28 — AB (ref 36–46)
Hemoglobin: 9 — AB (ref 12.0–16.0)
Neutrophils Absolute: 4
Platelets: 608 — AB (ref 150–399)
WBC: 7.7

## 2019-02-11 LAB — BASIC METABOLIC PANEL
BUN: 6 (ref 4–21)
CO2: 28 — AB (ref 13–22)
Chloride: 99 (ref 99–108)
Creatinine: 0.2 — AB (ref 0.5–1.1)
Glucose: 88
Potassium: 4.8 (ref 3.4–5.3)
Sodium: 137 (ref 137–147)

## 2019-02-11 LAB — CBC: RBC: 3.32 — AB (ref 3.87–5.11)

## 2019-02-11 LAB — COMPREHENSIVE METABOLIC PANEL
GFR calc Af Amer: 90
GFR calc non Af Amer: 90

## 2019-02-11 LAB — POCT ERYTHROCYTE SEDIMENTATION RATE, NON-AUTOMATED: Sed Rate: 122

## 2019-02-11 NOTE — Progress Notes (Signed)
Location:  [facility] Room Number: 114-P Place of Service:  SNF (31)  [name], MD  Patient Care Team: [name], MD as PCP - General (Internal Medicine)  Extended Emergency Contact Information Primary Emergency Contact: Alycia Rossetti Mobile Phone: [phone] Relation: Aunt    Allergies: Patient has no known allergies.  Chief Complaint  Patient presents with  . Medical Management of Chronic Issues    Routine [facility] visit    HPI: Patient is 73 y.o. female who   Past Medical History:  Diagnosis Date  . Alcohol abuse   . Demyelinating disorder ([place])   . Tobacco abuse     History reviewed. No pertinent surgical history.  Allergies as of 02/11/2019   No Known Allergies     Medication List       Accurate as of February 11, 2019 11:59 PM. If you have any questions, ask your nurse or doctor.        acetaminophen 500 MG tablet Commonly known as: TYLENOL Take 1,000 mg by mouth every 8 (eight) hours as needed for mild pain or headache.   calcium carbonate 500 MG chewable tablet Commonly known as: TUMS - dosed in mg elemental calcium Chew 1 tablet by mouth daily.   carbamide peroxide 6.5 % OTIC solution Commonly known as: DEBROX Place 5 drops into both ears daily. Apply x4 days for ear was softening   D3-1000 25 MCG (1000 UT) capsule Generic drug: Cholecalciferol Take 1,000 Units by mouth daily.   ENSURE ENLIVE PO Take 237 mLs by mouth 3 (three) times daily.   NUTRITIONAL SUPPLEMENT PO Take 1 each by mouth 2 (two) times daily. Magic Cup with lunch and dinner   feeding supplement (PRO-STAT SUGAR FREE 64) Liqd Take 30 mLs by mouth 2 (two) times daily.   folic acid 1 MG tablet Commonly known as: FOLVITE Take 1 mg by mouth daily.   iron polysaccharides 150 MG capsule Commonly known as: NIFEREX Take 150 mg by mouth 2 (two) times daily.   mirtazapine 30 MG tablet Commonly known as: REMERON Take  30 mg by mouth at bedtime.   multivitamin with minerals tablet Take 1 tablet by mouth daily.   polyethylene glycol 17 g packet Commonly known as: MIRALAX / GLYCOLAX Take 17 g by mouth 2 (two) times daily as needed for mild constipation.   senna-docusate 8.6-50 MG tablet Commonly known as: Senokot-S Take 2 tablets by mouth 2 (two) times daily.   thiamine 100 MG tablet Take 100 mg by mouth daily.   Vancomycin HCl in NaCl 750-0.9 MG/150ML-% Soln Inject 750 mg into the vein every 8 (eight) hours.   vitamin B-12 1000 MCG tablet Commonly known as: CYANOCOBALAMIN Take 1,000 mcg by mouth daily.   vitamin C 500 MG tablet Commonly known as: ASCORBIC ACID Take 500 mg by mouth 3 (three) times daily. With meals       No orders of the defined types were placed in this encounter.    There is no immunization history on file for this patient.  Social History   Tobacco Use  . Smoking status: Current Every Day Smoker    Packs/day: 0.50    Types: Cigarettes  . Smokeless tobacco: Never Used  Substance Use Topics  . Alcohol use: Never    Frequency: Never    Review of Systems  DATA OBTAINED: from patient, nurse, medical record, family member GENERAL:  no fevers, fatigue, appetite changes SKIN: No  itching, rash HEENT: No complaint RESPIRATORY: No cough, wheezing, SOB CARDIAC: No chest pain, palpitations, lower extremity edema  GI: No abdominal pain, No N/V/D or constipation, No heartburn or reflux  GU: No dysuria, frequency or urgency, or incontinence  MUSCULOSKELETAL: No unrelieved bone/joint pain NEUROLOGIC: No headache, dizziness  PSYCHIATRIC: No overt anxiety or sadness  Vitals:   02/11/19 1434  BP: 97/60  Pulse: 72  Resp: 20  Temp: 98.6 F (37 C)   Body mass index is 16.15 kg/m. Physical Exam  GENERAL APPEARANCE: Alert, conversant, No acute distress  SKIN: No diaphoresis rash HEENT: Unremarkable RESPIRATORY: Breathing is even, unlabored. Lung sounds are clear    CARDIOVASCULAR: Heart RRR no murmurs, rubs or gallops. No peripheral edema  GASTROINTESTINAL: Abdomen is soft, non-tender, not distended w/ normal bowel sounds.  GENITOURINARY: Bladder non tender, not distended  MUSCULOSKELETAL: No abnormal joints or musculature NEUROLOGIC: Cranial nerves 2-12 grossly intact. Moves all extremities PSYCHIATRIC: Mood and affect appropriate to situation, no behavioral issues  Patient Active Problem List   Diagnosis Date Noted  . Osteomyelitis of lumbar spine ([place]) 12/24/2018  . Sacral decubitus ulcer, stage IV ([place]) 12/24/2018  . Severe protein-calorie malnutrition Altamease Oiler: less than 60% of standard weight) ([place]) 12/24/2018  . Normocytic anemia 12/24/2018  . Vitamin D deficiency 12/24/2018  . Polymicrobial bacterial infection 12/21/2018  . Demyelinating disease of central nervous system ([place]) 12/02/2018  . Pressure ulcer, stage II ([place]) 12/02/2018  . Lumbar stenosis 11/24/2018  . Cervical stenosis of spine 11/24/2018  . Urinary retention 11/24/2018  . Tobacco abuse 11/24/2018  . Alcohol abuse 11/24/2018  . Hypokalemia 11/24/2018  . Hypomagnesemia 11/24/2018  . Hyponatremia 11/24/2018    CMP     Component Value Date/Time   NA 137 02/11/2019   K 4.8 02/11/2019   CL 99 02/11/2019   CO2 28 (A) 02/11/2019   BUN 6 02/11/2019   CREATININE 0.2 (A) 02/11/2019   AST 16 02/11/2019   ALT 7 02/11/2019   ALKPHOS 51 02/11/2019   GFRNONAA 90 02/11/2019   GFRAA 90 02/11/2019   Recent Labs    12/31/18 01/08/19 02/11/19  NA 140 137 137  K 4.2 4.5 4.8  CL  --   --  99  CO2  --   --  28*  BUN 10 10 6   CREATININE 0.3* 0.4* 0.2*   Recent Labs    12/31/18 01/08/19 02/11/19  AST 20 18 16   ALT 6* 6* 7  ALKPHOS 64 51 51   Recent Labs    12/31/18 01/08/19 02/11/19  WBC 9.9 9.6 7.7  NEUTROABS 5 5 4   HGB 8.3* 8.4* 9.0*  HCT 25* 25* 28*  PLT 638* 434* 608*   No results for input(s): CHOL, LDLCALC, TRIG in the last 8760 hours.  Invalid input(s): HCL  No results found for: MICROALBUR No results found for: TSH No results found for: HGBA1C No results found for: CHOL, HDL, LDLCALC, LDLDIRECT, TRIG, CHOLHDL  Significant Diagnostic Results in last 30 days:  No results found.  Assessment and Plan  No problem-specific Assessment & Plan notes found for this encounter.   Labs/tests ordered:    [name], MD    This encounter was created in error - please disregard.

## 2019-02-13 ENCOUNTER — Non-Acute Institutional Stay (SKILLED_NURSING_FACILITY): Payer: PPO | Admitting: Internal Medicine

## 2019-02-13 ENCOUNTER — Encounter: Payer: Self-pay | Admitting: Internal Medicine

## 2019-02-13 DIAGNOSIS — A499 Bacterial infection, unspecified: Secondary | ICD-10-CM

## 2019-02-13 DIAGNOSIS — I959 Hypotension, unspecified: Secondary | ICD-10-CM

## 2019-02-13 DIAGNOSIS — D473 Essential (hemorrhagic) thrombocythemia: Secondary | ICD-10-CM | POA: Diagnosis not present

## 2019-02-13 DIAGNOSIS — D649 Anemia, unspecified: Secondary | ICD-10-CM | POA: Diagnosis not present

## 2019-02-13 DIAGNOSIS — E43 Unspecified severe protein-calorie malnutrition: Secondary | ICD-10-CM | POA: Diagnosis not present

## 2019-02-13 DIAGNOSIS — D75839 Thrombocytosis, unspecified: Secondary | ICD-10-CM

## 2019-02-13 NOTE — Progress Notes (Signed)
This is an acute visit.  Level care skilled.  Facility [facility]  Chief complaint-acute visit follow-up protein calorie malnutrition weight loss.-Hypotension--thrombocytosis  History of present illness.  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  Subsequently she had an infection and reirrigation of the wound and had a wound VAC.  Cultures grew out E. coli Proteus and Enterococcus as well as Morganella she was started on IV ampicillin and Cipro which she completed she also completed a course of Flagyl.  She is now on vancomycin per infectious disease  She gets weekly labs which have shown a normal white count-she also has significant anemia and that actually has shown stability to improvement with a hemoglobin of 9.0 on the lab done on November 16.--She is on iron She also has a history of thrombocytosis suspect there is a reactive component and per recent labs this appears to be relatively stable She also gets a C-reactive protein which was nonreactive on the most recent labs sed rate continues to be elevated but fairly stable 122.  She also has what appears to be protein calorie malnutrition her prealbumin was 9 on lab done earlier this week albumin was 2.8 which appears to be relatively baseline with recent values.  She is on numerous supplements including Ensure 3 times a day Magic cup with meals and Protostat twice a day.  Per nursing notes it appears she is about 50%.  Weights have shown some fluctuation-it appears this been stable here over the past month with some variation.  Appears to run around  106-107 pounds over the past few weeks  She says she has a better appetite now and she was actually eating her lunch when I was in the room today.  She is on Remeron to help with appetite stimulation.  Blood pressure today noted to be 98/71-however per review of previous blood pressure it appears this is relatively baseline with systolics ranging from the 90s to the lower 100s-she does not complain of any dizziness any increased weakness from baseline syncope palpitations this appears to be well-tolerated   MEDICATIONS acetaminophen 500 MG tablet Commonly known as: TYLENOL Take 1,000 mg by mouth every 8 (eight) hours as needed for mild pain or headache.   calcium carbonate 500 MG chewable tablet Commonly known as: TUMS - dosed in mg elemental calcium Chew 1 tablet by mouth daily.   carbamide peroxide 6.5 % OTIC solution Commonly known as: DEBROX Place 5 drops into both ears daily. Apply x4 days for ear was softening   D3-1000 25 MCG (1000 UT) capsule Generic drug: Cholecalciferol Take 1,000 Units by mouth daily.   ENSURE ENLIVE PO Take 237 mLs by mouth 3 (three) times daily.   NUTRITIONAL SUPPLEMENT PO Take 1 each by mouth 2 (two) times daily. Magic Cup with lunch and dinner   feeding supplement (PRO-STAT SUGAR FREE 64) Liqd Take 30 mLs by mouth 2 (two) times daily.   folic acid 1 MG tablet Commonly known as: FOLVITE Take 1 mg by mouth daily.   iron polysaccharides 150 MG capsule Commonly known as: NIFEREX Take 150 mg by mouth 2 (two) times daily.  mirtazapine 30 MG tablet Commonly known as: REMERON Take 30 mg by mouth at bedtime.   multivitamin with minerals tablet Take 1 tablet by mouth daily.   polyethylene glycol 17 g packet Commonly known as: MIRALAX / GLYCOLAX Take 17 g by mouth 2 (two) times daily as needed for mild constipation.   senna-docusate 8.6-50 MG tablet Commonly known as: Senokot-S Take 2 tablets by mouth 2 (two) times daily.    thiamine 100 MG tablet Take 100 mg by mouth daily.   Vancomycin HCl in NaCl 750-0.9 MG/150ML-% Soln Inject 750 mg into the vein every 8 (eight) hours.   vitamin B-12 1000 MCG tablet Commonly known as: CYANOCOBALAMIN Take 1,000 mcg by mouth daily.   vitamin C 500 MG tablet Commonly known as: ASCORBIC ACID Take 500 mg by mouth 3 (three) times daily. With meals        .  Social History        Tobacco Use  . Smoking status: Current Every Day Smoker    Packs/day: 0.50    Types: Cigarettes  . Smokeless tobacco: Never Used  Substance Use Topics  . Alcohol use: Never    Frequency: Never    Review of Systems  DATA OBTAINED: from patient, nurse, medical record  General no complaints of fever chills says she is feeling stronger.  Skin is not complaining at this time of rashes or itching does have a history of rash in the past.  Head ears eyes nose mouth and throat is not complain of visual changes or sore throat.  Respiratory denies any shortness of breath or cough.  Cardiac does not complain of any chest pain or palpitations or edema.  GI does not complain of abdominal discomfort diarrhea nausea vomiting or constipation.  GU denies any dysuria she does have an indwelling Foley catheter.  Musculoskeletal does have significant lower extremity weakness appears to possibly have gained some strength in her upper extremities.  Neurologic as noted above she does not complain of numbness headache does have significant weakness of her lower extremities.  And psych does not complain of being depressed at this time or anxious she appears to be in good spirits with a positive outlook.  Marland Kitchen  Physical exam.  Temperature 99.0 pulse 89 respirations 18 blood pressure 98/71.  In general this is a pleasant somewhat frail-appearing elderly female in no distress lying comfortably in bed.  Her skin is warm and dry.  She does have a history of a sacral ulcer not  visualized secondary to positioning this is followed by wound care.  Eyes sclera and conjunctive appear to be clear visual acuity intact.  Oropharynx is clear mucous membranes appear fairly moist.  Chest is clear to auscultation there is no labored breathing.  Heart is regular rate and rhythm she does not really have any significant edema.  Her abdomen is soft nontender with active bowel sounds.  GU she does have an indwelling Foley catheter.  Musculoskeletal is able to move upper extremities she does have a PICC line right upper arm I do not see any drainage bleeding or sign of infection.  She has extensive lower extremity weakness her lower extremities are propped up on pillows currently.  Neurologic as noted above her speech is clear upper extremity strength appears to be relatively baseline with significant lower extremity weakness.  Psych she is alert and oriented pleasant and appropriate.  Labs.  February 11, 2019.  Sodium 137 potassium 4.8 BUN 64 creatinine 0.2.  Albumin 2.8-prealbumin  9.  WBC 7.7 hemoglobin 9.0 platelets 608,000.  Sed rate is 122.  CRP is nonreactive.  Assessment and plan.  1.  History of weight loss protein calorie malnutrition she is on numerous supplements as noted above including Ensure Protostat Magic cup and she is on Remeron for appetite stimulation-she feels she is eating somewhat better and has a better appetite-I did not strongly encourage her to continue to try to eat that she would not get stronger without that she expressed understanding.  At this point appears to have a positive attitude about this but will have to be watched her weight continue to be somewhat variable appears to been relatively stable over the past several weeks.  2.  History of polymicrobial infection she is now on vancomycin and followed closely by infectious disease her white count is within normal range she is essentially afebrile.  3.-History of anemia she  continues on iron hemoglobin has shown stability actually at the higher end of her recent baseline at 9.0 she does get weekly labs ordered by infectious disease.  4.  Thrombocytosis this appears to have some chronicity and I suspect there is a reactive component here last platelet count was 608,000 which appears to be essentially baseline per review of previous labs.  5.  Hypotension at times she will have systolics in the 0000000 which is the case today she is asymptomatic does not complain of any dizziness or syncope or palpitations at this point will monitor recent systolics have ranged from the mid 90s to 120.  [id number]--note  greater than 25 minutes spent assessing patient discussing her status with nursing staff and reviewing her chart and labs-greater than 50% of time spent coordinating a plan of care

## 2019-02-16 ENCOUNTER — Encounter: Payer: Self-pay | Admitting: Internal Medicine

## 2019-02-16 NOTE — Assessment & Plan Note (Signed)
Continues; patient is albumin is 2.8 which may be slightly higher but her prealbumin is 9, normal range being 20-40; continue supplements

## 2019-02-16 NOTE — Assessment & Plan Note (Signed)
Patient was due to stop her antibiotics on 11/3; however prior antibiotics were stopped and vancomycin was continued; is being followed by infectious disease and will be stopped by them; patient sed rate is 122 while white count remains normal at 9.0

## 2019-02-16 NOTE — Assessment & Plan Note (Signed)
Patient has not regained use of her legs.

## 2019-02-16 NOTE — Progress Notes (Signed)
Location:   [facility]:   SNF  [name], MD  Patient Care Team: [name], MD as PCP - General (Internal Medicine)  Extended Emergency Contact Information Primary Emergency Contact: Alycia Rossetti Mobile Phone: [phone] Relation: Aunt    Allergies: Patient has no known allergies.  Chief Complaint  Patient presents with  . Medical Management of Chronic Issues    HPI: Patient is 73 y.o. female who is being seen for routine issues of polymicrobial infection, severe protein calorie malnutrition, and demyelinating disease of central nervous system.  Past Medical History:  Diagnosis Date  . Alcohol abuse   . Demyelinating disorder ([place])   . Tobacco abuse     History reviewed. No pertinent surgical history.  Allergies as of 02/11/2019   No Known Allergies     Medication List       Accurate as of February 11, 2019 11:59 PM. If you have any questions, ask your nurse or doctor.        acetaminophen 500 MG tablet Commonly known as: TYLENOL Take 1,000 mg by mouth every 8 (eight) hours as needed for mild pain or headache.   calcium carbonate 500 MG chewable tablet Commonly known as: TUMS - dosed in mg elemental calcium Chew 1 tablet by mouth daily.   carbamide peroxide 6.5 % OTIC solution Commonly known as: DEBROX Place 5 drops into both ears daily. Apply x4 days for ear was softening   D3-1000 25 MCG (1000 UT) capsule Generic drug: Cholecalciferol Take 1,000 Units by mouth daily.   ENSURE ENLIVE PO Take 237 mLs by mouth 3 (three) times daily.   NUTRITIONAL SUPPLEMENT PO Take 1 each by mouth 2 (two) times daily. Magic Cup with lunch and dinner   feeding supplement (PRO-STAT SUGAR FREE 64) Liqd Take 30 mLs by mouth 2 (two) times daily.   folic acid 1 MG tablet Commonly known as: FOLVITE Take 1 mg by mouth daily.   iron polysaccharides 150 MG capsule Commonly known as: NIFEREX Take 150 mg by mouth 2 (two) times  daily.   mirtazapine 30 MG tablet Commonly known as: REMERON Take 30 mg by mouth at bedtime.   multivitamin with minerals tablet Take 1 tablet by mouth daily.   polyethylene glycol 17 g packet Commonly known as: MIRALAX / GLYCOLAX Take 17 g by mouth 2 (two) times daily as needed for mild constipation.   senna-docusate 8.6-50 MG tablet Commonly known as: Senokot-S Take 2 tablets by mouth 2 (two) times daily.   thiamine 100 MG tablet Take 100 mg by mouth daily.   Vancomycin HCl in NaCl 750-0.9 MG/150ML-% Soln Inject 750 mg into the vein every 8 (eight) hours.   vitamin B-12 1000 MCG tablet Commonly known as: CYANOCOBALAMIN Take 1,000 mcg by mouth daily.   vitamin C 500 MG tablet Commonly known as: ASCORBIC ACID Take 500 mg by mouth 3 (three) times daily. With meals       No orders of the defined types were placed in this encounter.    There is no immunization history on file for this patient.  Social History   Tobacco Use  . Smoking status: Current Every Day Smoker    Packs/day: 0.50    Types: Cigarettes  . Smokeless tobacco: Never Used  Substance Use Topics  . Alcohol use: Never    Frequency: Never    Review of Systems  DATA OBTAINED: from patient, nurse GENERAL:  no fevers, fatigue, poor appetite  SKIN: No itching, rash HEENT: No complaint RESPIRATORY: No cough, wheezing, SOB CARDIAC: No chest pain, palpitations, lower extremity edema  GI: No abdominal pain, No N/V/D or constipation, No heartburn or reflux  GU: No dysuria, frequency or urgency, or incontinence  MUSCULOSKELETAL: No unrelieved bone/joint pain NEUROLOGIC: No headache, dizziness  PSYCHIATRIC: No overt anxiety or sadness  Vitals:   02/16/19 1632  BP: 96/60  Pulse: 72  Resp: 20  Temp: 98.6 F (37 C)   Body mass index is 16.12 kg/m. Physical Exam  GENERAL APPEARANCE: Alert, conversant, No acute distress, appears weak SKIN: No diaphoresis rash HEENT: Unremarkable RESPIRATORY:  Breathing is even, unlabored. Lung sounds are clear   CARDIOVASCULAR: Heart RRR no murmurs, rubs or gallops. No peripheral edema  GASTROINTESTINAL: Abdomen is soft, non-tender, not distended w/ normal bowel sounds.  GENITOURINARY: Bladder non tender, not distended  MUSCULOSKELETAL: No abnormal joints or musculature NEUROLOGIC: Cranial nerves 2-12 grossly intact; moves upper extremities, not lower extremities PSYCHIATRIC: Mood and affect appropriate to situation, no behavioral issues  Patient Active Problem List   Diagnosis Date Noted  . Osteomyelitis of lumbar spine ([place]) 12/24/2018  . Sacral decubitus ulcer, stage IV ([place]) 12/24/2018  . Severe protein-calorie malnutrition Altamease Oiler: less than 60% of standard weight) ([place]) 12/24/2018  . Normocytic anemia 12/24/2018  . Vitamin D deficiency 12/24/2018  . Polymicrobial bacterial infection 12/21/2018  . Demyelinating disease of central nervous system ([place]) 12/02/2018  . Pressure ulcer, stage II ([place]) 12/02/2018  . Lumbar stenosis 11/24/2018  . Cervical stenosis of spine 11/24/2018  . Urinary retention 11/24/2018  . Tobacco abuse 11/24/2018  . Alcohol abuse 11/24/2018  . Hypokalemia 11/24/2018  . Hypomagnesemia 11/24/2018  . Hyponatremia 11/24/2018    CMP     Component Value Date/Time   NA 137 02/11/2019   K 4.8 02/11/2019   CL 99 02/11/2019   CO2 28 (A) 02/11/2019   BUN 6 02/11/2019   CREATININE 0.2 (A) 02/11/2019   CALCIUM 8.3 (A) 02/06/2019   ALBUMIN 2.6 (A) 02/06/2019   AST 16 02/11/2019   ALT 7 02/11/2019   ALKPHOS 51 02/11/2019   GFRNONAA 90 02/11/2019   GFRAA 90 02/11/2019   Recent Labs    01/08/19 02/06/19 02/11/19  NA 137 138 137  K 4.5 3.9 4.8  CL  --  100 99  CO2  --  28* 28*  BUN 10 7 6   CREATININE 0.4* 0.2* 0.2*  CALCIUM  --  8.3*  --    Recent Labs    01/08/19 02/06/19 02/11/19  AST 18 22 16   ALT 6* 7 7  ALKPHOS 51 41 51  ALBUMIN  --  2.6*  --    Recent Labs    01/08/19 02/06/19 02/11/19  WBC 9.6  7.7 7.7  NEUTROABS 5 4 4   HGB 8.4* 7.9* 9.0*  HCT 25* 24* 28*  PLT 434* 628* 608*   No results for input(s): CHOL, LDLCALC, TRIG in the last 8760 hours.  Invalid input(s): HCL No results found for: MICROALBUR No results found for: TSH No results found for: HGBA1C No results found for: CHOL, HDL, LDLCALC, LDLDIRECT, TRIG, CHOLHDL  Significant Diagnostic Results in last 30 days:  No results found.  Assessment and Plan  Polymicrobial bacterial infection Patient was due to stop her antibiotics on 11/3; however prior antibiotics were stopped and vancomycin was continued; is being followed by infectious disease and will be stopped by them; patient sed rate is 122 while white count remains normal at 9.0  Severe protein-calorie malnutrition Altamease Oiler: less than 60% of standard weight) ([place]) Continues; patient is albumin is 2.8 which may be slightly higher but her prealbumin is 9, normal range being 20-40; continue supplements  Demyelinating disease of central nervous system [facility]) Patient has not regained use of her legs.      [name], MD

## 2019-02-18 LAB — BASIC METABOLIC PANEL
BUN: 9 (ref 4–21)
CO2: 30 — AB (ref 13–22)
Chloride: 101 (ref 99–108)
Creatinine: 0.3 — AB (ref 0.5–1.1)
Glucose: 71
Potassium: 4.4 (ref 3.4–5.3)
Sodium: 137 (ref 137–147)

## 2019-02-18 LAB — HEPATIC FUNCTION PANEL
ALT: 6 — AB (ref 7–35)
AST: 14 (ref 13–35)
Alkaline Phosphatase: 52 (ref 25–125)
Bilirubin, Total: 0.2

## 2019-02-18 LAB — COMPREHENSIVE METABOLIC PANEL
Albumin: 2.6 — AB (ref 3.5–5.0)
Calcium: 8.5 — AB (ref 8.7–10.7)
GFR calc Af Amer: 90
GFR calc non Af Amer: 90

## 2019-02-20 LAB — CBC AND DIFFERENTIAL
HCT: 26 — AB (ref 36–46)
Hemoglobin: 8.6 — AB (ref 12.0–16.0)
Neutrophils Absolute: 3
Platelets: 512 — AB (ref 150–399)
WBC: 6.4

## 2019-02-20 LAB — BASIC METABOLIC PANEL
BUN: 14 (ref 4–21)
CO2: 29 — AB (ref 13–22)
Chloride: 99 (ref 99–108)
Creatinine: 0.3 — AB (ref 0.5–1.1)
Glucose: 90
Potassium: 4.7 (ref 3.4–5.3)
Sodium: 135 — AB (ref 137–147)

## 2019-02-20 LAB — COMPREHENSIVE METABOLIC PANEL
Albumin: 3 — AB (ref 3.5–5.0)
Calcium: 9 (ref 8.7–10.7)
GFR calc Af Amer: 90
GFR calc non Af Amer: 90
Globulin: 3

## 2019-02-20 LAB — HEPATIC FUNCTION PANEL
ALT: 6 — AB (ref 7–35)
AST: 12 — AB (ref 13–35)
Alkaline Phosphatase: 59 (ref 25–125)
Bilirubin, Total: 0.2

## 2019-02-20 LAB — POCT ERYTHROCYTE SEDIMENTATION RATE, NON-AUTOMATED: Sed Rate: 100

## 2019-02-20 LAB — CBC: RBC: 3.12 — AB (ref 3.87–5.11)

## 2019-02-25 DIAGNOSIS — L89154 Pressure ulcer of sacral region, stage 4: Secondary | ICD-10-CM | POA: Diagnosis not present

## 2019-02-27 DIAGNOSIS — Z981 Arthrodesis status: Secondary | ICD-10-CM | POA: Diagnosis not present

## 2019-02-27 DIAGNOSIS — M4322 Fusion of spine, cervical region: Secondary | ICD-10-CM | POA: Diagnosis not present

## 2019-03-04 DIAGNOSIS — L89154 Pressure ulcer of sacral region, stage 4: Secondary | ICD-10-CM | POA: Diagnosis not present

## 2019-03-04 LAB — CBC: RBC: 3.26 — AB (ref 3.87–5.11)

## 2019-03-04 LAB — BASIC METABOLIC PANEL
BUN: 14 (ref 4–21)
CO2: 23 — AB (ref 13–22)
Chloride: 98 — AB (ref 99–108)
Creatinine: 0.3 — AB (ref 0.5–1.1)
Glucose: 62
Potassium: 5.7 — AB (ref 3.4–5.3)
Sodium: 137 (ref 137–147)

## 2019-03-04 LAB — HEPATIC FUNCTION PANEL
ALT: 9 (ref 7–35)
AST: 20 (ref 13–35)
Alkaline Phosphatase: 57 (ref 25–125)
Bilirubin, Total: 0.2

## 2019-03-04 LAB — COMPREHENSIVE METABOLIC PANEL
Albumin: 3.1 — AB (ref 3.5–5.0)
Calcium: 8.9 (ref 8.7–10.7)
GFR calc Af Amer: 90
GFR calc non Af Amer: 90
Globulin: 3

## 2019-03-04 LAB — CBC AND DIFFERENTIAL
HCT: 27 — AB (ref 36–46)
Hemoglobin: 8.8 — AB (ref 12.0–16.0)
Neutrophils Absolute: 4
Platelets: 596 — AB (ref 150–399)
WBC: 8

## 2019-03-04 LAB — POCT ERYTHROCYTE SEDIMENTATION RATE, NON-AUTOMATED: Sed Rate: 145

## 2019-03-05 ENCOUNTER — Encounter: Payer: Self-pay | Admitting: Internal Medicine

## 2019-03-05 ENCOUNTER — Non-Acute Institutional Stay (SKILLED_NURSING_FACILITY): Payer: PPO | Admitting: Internal Medicine

## 2019-03-05 DIAGNOSIS — D649 Anemia, unspecified: Secondary | ICD-10-CM

## 2019-03-05 DIAGNOSIS — E875 Hyperkalemia: Secondary | ICD-10-CM | POA: Diagnosis not present

## 2019-03-05 DIAGNOSIS — A499 Bacterial infection, unspecified: Secondary | ICD-10-CM | POA: Diagnosis not present

## 2019-03-05 DIAGNOSIS — E43 Unspecified severe protein-calorie malnutrition: Secondary | ICD-10-CM

## 2019-03-05 LAB — BASIC METABOLIC PANEL
BUN: 17 (ref 4–21)
CO2: 28 — AB (ref 13–22)
Chloride: 101 (ref 99–108)
Creatinine: 0.3 — AB (ref 0.5–1.1)
Glucose: 115
Potassium: 4.8 (ref 3.4–5.3)
Sodium: 140 (ref 137–147)

## 2019-03-05 LAB — COMPREHENSIVE METABOLIC PANEL
Calcium: 8.9 (ref 8.7–10.7)
GFR calc Af Amer: 90
GFR calc non Af Amer: 90

## 2019-03-05 NOTE — Progress Notes (Signed)
This is an acute visit.  Level care skilled.  [facility]  Chief complaint acute visit secondary to question hyperkalemia-follow-up of infection of lumbar spine with osteomyelitis.  History of present illness.  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  She had a lumbar laminectomy and a cervical laminectomy also wound repair on the lumbar spine along with repair of a dural leak-subsequently she had infection and reirrigation of the wound and had a wound VAC.  Cultures grew out E. coli Proteus and Enterococcus as well as Morganella she was started on IV ampicillin and Cipro and has completed a course she also completed course of Flagyl.  She does continue on vancomycin per recommendation by infectious   She continues to get weekly labs which are followed by infectious disease-on lab done yesterday hemoglobin was 8.8 which shows relative stability she has chronic thrombocytosis with last platelet count of 96,000-she also gets a CRP late takes which was nonreactive and her sed rate on Monday was 145.  Again these findings will be sent to infectious disease and a vancomycin level will be sent to his pharmacy for their input.  Of note her albumin on yesterday's lab was 3.1 and prealbumin was 7.  She continues on numerous dietary supplements.  She says she eats fairly well but appears over the last month she has lost about 5 pounds updated weight is pending however  Labs done yesterday does show a mildly elevated potassium at 5.7-she is not on an ACE inhibitor or potassium supplementation-we will recheck this to ensure accuracy.  Currently she is resting in bed comfortably she does not have any complaints.  She had a low-grade fever a couple nights ago but there has been no  reoccurrence she is being treated for her sacral --she does not complain of any increased cough or shortness of breath or dysuria she does have a chronic indwelling Foley catheter Past Medical History:  Diagnosis Date  . Alcohol abuse   . Demyelinating disorder ([place])   . Tobacco abuse     History reviewed. No pertinent surgical history.  Allergies as of 02/11/2019   No Known Allergies                                                MEDICATIONS acetaminophen500 MG tablet Commonly known as: TYLENOL Take 1,000 mg by mouth every 8 (eight) hours as needed for mild pain or headache.   calcium carbonate500 MG chewable tablet Commonly known as: TUMS - dosed in mg elemental calcium Chew 1 tablet by mouth daily.   carbamide peroxide6.5 % OTIC solution Commonly known as: DEBROX Place 5 drops into both ears daily. Apply x4 days for ear was softening   [id number] MCG (1000 UT) capsule Generic drug: Cholecalciferol Take 1,000 Units by mouth daily.   ENSURE ENLIVE PO Take 237 mLs by mouth 3 (three) times daily.   NUTRITIONAL SUPPLEMENT PO Take 1 each by mouth 2 (two) times daily. Magic Cup with lunch and dinner   feeding supplement (PRO-STAT SUGAR FREE 64)Liqd Take 30 mLs by mouth 2 (two) times daily.   folic acid1 MG tablet Commonly known as: FOLVITE Take 1 mg by mouth  daily.   iron polysaccharides150 MG capsule Commonly known as: NIFEREX Take 150 mg by mouth 2 (two) times daily.   mirtazapine30 MG tablet Commonly known as: REMERON Take 30 mg by mouth at bedtime.   multivitamin with mineralstablet Take 1 tablet by mouth daily.   polyethylene glycol17 g packet Commonly known as: MIRALAX / GLYCOLAX Take 17 g by mouth 2 (two) times daily as needed for mild constipation.   senna-docusate8.6-50 MG tablet Commonly known as: Senokot-S Take 2 tablets by mouth 2 (two) times daily.   thiamine100 MG tablet Take 100 mg by  mouth daily.   Vancomycin HCl in NaCl750-0.9 MG/150ML-% Soln Inject 750 mg into the vein every 8 (eight) hours.   vitamin B-121000 MCG tablet Commonly known as: CYANOCOBALAMIN Take 1,000 mcg by mouth daily.   vitamin C500 MG tablet Commonly known as: ASCORBIC ACID Take 500 mg by mouth 3 (three) times daily. With meals        .  Social History        Tobacco Use  . Smoking status: Current Every Day Smoker    Packs/day: 0.50    Types: Cigarettes  . Smokeless tobacco: Never Used  Substance Use Topics  . Alcohol use: Never    Frequency: Never        Review of systems.  In general she is not complaining of any fever or chills.  Skin does not complain of rashes itching or diaphoresis does have history of sacral wound and lumbar surgery that is followed by wound care and continues on vancomycin.  Head ears eyes nose mouth and throat is not complaining of visual changes or sore throat.  Respiratory is not complain of being short of breath or having an increased cough.  Cardiac denies chest pain or edema.  GI continues to have somewhat of a spotty appetite does not complain of abdominal pain nausea vomiting diarrhea or constipation.  GU has an indwelling Foley catheter denies dysuria.  Musculoskeletal no significant lower extremity weakness at baseline does not complain of pain however this morning.  Neurologic as noted above says her numbness in her upper extremities hands is improving does not complain of a headache or dizziness.  Psych does not complain of being depressed or anxious.  Continues with a fairly positive outlook.  Physical exam.  Temperature is 98.4 pulse 87 respirations 18 blood pressure 92/52 last noted weight 101.6 again update weight is pending.  General this is a pleasant somewhat frail elderly female in no distress she is bright and alert.  Her skin is warm and dry sacral area and lumbar surgery area currently  covered-this is followed by wound care.  Eyes visual acuity appears to be intact sclera and conjunctive appear to be relatively clear.  Oropharynx is clear mucous membranes appear fairly moist.  Chest is clear to auscultation there is no labored breathing.  Heart is regular rate and rhythm without murmur gallop or rub she does not really have edema.  Her abdomen is soft nontender with active bowel sounds.  GU has an indwelling Foley catheter draining amber-colored urine looks like the bag is recently been emptied.  Musculoskeletal is able to move her upper extremities at baseline appears to have gained some strength she does have a PICC line in her right upper arm which appears remarkable continues with significant lower extremity weakness.  Neurologic as noted above her speech is clear with extensive lower extremity weakness.  Psych she is alert and oriented pleasant and appropriate.  Labs.  March 04, 2019.  Sodium 137 potassium 5.7?-BUN 14.4 creatinine 0.8.  Albumin 3.1 otherwise liver function tests within normal limits prealbumin was 7.  WBC 8.0 hemoglobin 8.8 platelets 596.  CRP was nonreactive.  ESR was 145.  Assessment plan.  1.  Hyperkalemia-will recheck a BMP to ensure accuracy potassium is mildly elevated at 5.7 she is not on supplementation or a potassium sparing medication-will recheck to ensure accuracy clinically she appears to be stable in this regards.  2.  History of polymicrobial infection lumbar spine she is on vancomycin and followed by infectious disease a couple nights ago she had a low-grade fever but this appears to have been transitory she appears to be at her baseline today white count was normal on lab done yesterday at this point will continue to monitor  She continues to deny any fever chills increased cough congestion or dysuria.  3.  History of weight loss protein calorie malnutrition update weight is pending she is on numerous supplements as  well as Remeron she says at times she does not like the food which lead to apparently at times spotty appetite-- she has been encouraged to eat-and will await updated weights--albumin of 3.1 shows mild improvement over last month  #4 history of anemia hemoglobin appears relatively baseline at 8.8 she is on iron.  5.  Thrombocytosis with chronicity with infectious disease process this appears fairly stable with last platelet count of 596,000.  6.  History of hypotension at times she has systolics in the 26R it appears her average is more in the low 100s but not unusual to be in the 90s this appears to be well-tolerated at this point will monitor-- she is not on any antihypertensives or diuretics.  [id number]

## 2019-03-08 ENCOUNTER — Encounter: Payer: Self-pay | Admitting: Internal Medicine

## 2019-03-11 ENCOUNTER — Encounter: Payer: Self-pay | Admitting: Internal Medicine

## 2019-03-11 DIAGNOSIS — L89154 Pressure ulcer of sacral region, stage 4: Secondary | ICD-10-CM | POA: Diagnosis not present

## 2019-03-11 NOTE — Progress Notes (Signed)
Location:  [facility] Room Number: [id number] Place of Service:  SNF [phone]) Provider:  Gr[place] [facility], PA-C  Mary Duos, MD  Patient Care Team: Mary Duos, MD as PCP - General (Internal Medicine)  Extended Emergency Contact Information Primary Emergency Contact: Alycia Rossetti Mobile Phone: [phone] Relation: Aunt  Code Status:  DNR Goals of care: Advanced Directive information Advanced Directives 03/11/2019  Does Patient Have a Medical Advance Directive? Yes  Type of Advance Directive Out of facility DNR (pink MOST or yellow form)  Does patient want to make changes to medical advance directive? No - Patient declined  Pre-existing out of facility DNR order (yellow form or pink MOST form) -     Chief Complaint  Patient presents with  . Medical Management of Chronic Issues    Routine [facility] visit    HPI:  Pt is a 73 y.o. female seen today for medical management of chronic diseases.      Past Medical History:  Diagnosis Date  . Alcohol abuse   . Demyelinating disorder ([place])   . Tobacco abuse    History reviewed. No pertinent surgical history.  No Known Allergies  Outpatient Encounter Medications as of 03/11/2019  Medication Sig  . acetaminophen (TYLENOL) 500 MG tablet Take 1,000 mg by mouth every 8 (eight) hours as needed for mild pain or headache.   . Amino Acids-Protein Hydrolys (FEEDING SUPPLEMENT, PRO-STAT SUGAR FREE 64,) LIQD Take 30 mLs by mouth 2 (two) times daily.  . calcium carbonate (TUMS - DOSED IN MG ELEMENTAL CALCIUM) 500 MG chewable tablet Chew 1 tablet by mouth daily.  . Cholecalciferol (D3-1000) 25 MCG (1000 UT) capsule Take 1,000 Units by mouth daily.  . folic acid (FOLVITE) 1 MG tablet Take 1 mg by mouth daily.  . iron polysaccharides (NIFEREX) 150 MG capsule Take 150 mg by mouth 2 (two) times daily.  . mirtazapine (REMERON) 30 MG tablet Take 30 mg by mouth at bedtime.  . Multiple Vitamins-Minerals  (MULTIVITAMIN WITH MINERALS) tablet Take 1 tablet by mouth daily.  . Nutritional Supplements (ENSURE ENLIVE PO) Take 237 mLs by mouth 3 (three) times daily.  . Nutritional Supplements (NUTRITIONAL SUPPLEMENT PO) Take 1 each by mouth 2 (two) times daily. Magic Cup with lunch and dinner  . polyethylene glycol (MIRALAX / GLYCOLAX) 17 g packet Take 17 g by mouth 2 (two) times daily as needed for mild constipation.  . senna-docusate (SENOKOT-S) 8.6-50 MG tablet Take 2 tablets by mouth 2 (two) times daily.  Marland Kitchen thiamine 100 MG tablet Take 100 mg by mouth daily.  . Vancomycin HCl in NaCl 750-0.9 MG/150ML-% SOLN Inject 250 mg into the vein every 12 (twelve) hours.   . vitamin B-12 (CYANOCOBALAMIN) 1000 MCG tablet Take 1,000 mcg by mouth daily.  . vitamin C (ASCORBIC ACID) 500 MG tablet Take 500 mg by mouth 3 (three) times daily. With meals   No facility-administered encounter medications on file as of 03/11/2019.    Review of Systems   There is no immunization history on file for this patient. Pertinent  Health Maintenance Due  Topic Date Due  . MAMMOGRAM  07/29/1996  . COLONOSCOPY  07/29/1996  . DEXA SCAN  07/30/2011  . INFLUENZA VACCINE  06/26/2019 (Originally 10/27/2018)  . PNA vac Low Risk Adult (1 of 2 - PCV13) 03/10/2020 (Originally 07/30/2011)   Fall Risk  10/26/2018  Falls in the past year? (No Data)  Comment Emmi Telephone Survey: data to providers  prior to load  Number falls in past yr: (No Data)  Comment Emmi Telephone Survey Actual Response =    Functional Status Survey:    Vitals:   03/11/19 0823  BP: 98/62  Pulse: 86  Resp: 20  Temp: 99 F (37.2 C)  TempSrc: Oral  Weight: 103 lb 3.2 oz (46.8 kg)  Height: 5\' 8"  (1.727 m)   Body mass index is 15.69 kg/m. Physical Exam  Labs reviewed: Recent Labs    02/20/19 0000 03/04/19 0000 03/05/19 0000  NA 135* 137 140  K 4.7 5.7* 4.8  CL 99 98* 101  CO2 29* 23* 28*  BUN 14 14 17   CREATININE 0.3* 0.3* 0.3*  CALCIUM 9.0  8.9 8.9   Recent Labs    02/18/19 0000 02/20/19 0000 03/04/19 0000  AST 14 12* 20  ALT 6* 6* 9  ALKPHOS 52 59 57  ALBUMIN 2.6* 3.0* 3.1*   Recent Labs    02/11/19 0000 02/20/19 0000 03/04/19 0000  WBC 7.7 6.4 8.0  NEUTROABS 4 3 4   HGB 9.0* 8.6* 8.8*  HCT 28* 26* 27*  PLT 608* 512* 596*   No results found for: TSH No results found for: HGBA1C No results found for: CHOL, HDL, LDLCALC, LDLDIRECT, TRIG, CHOLHDL  Significant Diagnostic Results in last 30 days:  No results found.  Assessment/Plan There are no diagnoses linked to this encounter.   Family/ staff C

## 2019-03-13 NOTE — Progress Notes (Signed)
This encounter was created in error - please disregard.

## 2019-03-18 DIAGNOSIS — L89154 Pressure ulcer of sacral region, stage 4: Secondary | ICD-10-CM | POA: Diagnosis not present

## 2019-03-21 ENCOUNTER — Non-Acute Institutional Stay (SKILLED_NURSING_FACILITY): Payer: PPO | Admitting: Internal Medicine

## 2019-03-21 DIAGNOSIS — I959 Hypotension, unspecified: Secondary | ICD-10-CM | POA: Diagnosis not present

## 2019-03-21 DIAGNOSIS — D649 Anemia, unspecified: Secondary | ICD-10-CM

## 2019-03-21 DIAGNOSIS — A499 Bacterial infection, unspecified: Secondary | ICD-10-CM | POA: Diagnosis not present

## 2019-03-21 DIAGNOSIS — R509 Fever, unspecified: Secondary | ICD-10-CM | POA: Diagnosis not present

## 2019-03-21 NOTE — Progress Notes (Signed)
This is an acute visit.  Level care skilled.  [facility]  Chief complaint acute visit secondary to occasional temperature elevations.  History of present illness.  Patient is a very pleasant 73 year old female who is here for IV antibiotics with a history of a stage IV sacral wound.  She does have a history of of polymicrobial nfection of the lumbar spine with osteomyelitis she is on vancomycin and this is followed closely by infectious disease at [facility].  She had a lumbar laminectomy and a cervical laminectomy also wound repair on the lumbar spine along with repair of a dural leak-subsequently-- she had infection and reirrigation of the wound and had a wound VAC.  Cultures grew out E. coli Proteus and Enterococcus as well as Morganella she was started on IV ampicillin and Cipro and has completed a course---- she also completed course of Flagyl.  She does continue on vancomycin per recommendation by infectious disease at [facility].  Her other diagnoses include failure to thrive with protein calorie malnutrition-- staff has been encouraging her to eat she thinks she is doing a bit better although this continues to be a challenge she is on Remeron for appetite stimulation as well as supplements.  Nursing staff at times notes she has a mildly elevated temperature sometimes it slightly over 100 degrees-it appears at times she will have temperatures in the higher 99-100 range but this is not persistent and oftentimes at next temperature check is in normal range-- and that appears to be the case today.  With  mostrecent temps ranging from 99 4-98 range  Nursing staff states that she does wear a lot of blankets and sometimes this can lead to some elevations as well  She is again on vancomycin with a history of the osteomyelitis with the sacral wound.  She does not complain of any fever chills or dysuria she does have an indwelling Foley  catheter.  She denies any cough shortness of breath or chest congestion or diarrhea  Currently temperature is is ranging from 98-99.4.  Past Medical History:  Diagnosis Date  . Alcohol abuse   . Demyelinating disorder ([place])   . Tobacco abuse     History reviewed. No pertinent surgical history.  Allergies as of 02/11/2019  No Known Allergies                                                MEDICATIONS acetaminophen500 MG tablet Commonly known as: TYLENOL Take 1,000 mg by mouth every 8 (eight) hours as needed for mild pain or headache.   calcium carbonate500 MG chewable tablet Commonly known as: TUMS - dosed in mg elemental calcium Chew 1 tablet by mouth daily.   carbamide peroxide6.5 % OTIC solution Commonly known as: DEBROX Place 5 drops into both ears daily. Apply x4 days for ear was softening   [id number] MCG (1000 UT) capsule Generic drug: Cholecalciferol Take 1,000 Units by mouth daily.   ENSURE ENLIVE PO Take 237 mLs by mouth 3 (three) times daily.   NUTRITIONAL SUPPLEMENT PO Take 1 each by mouth 2 (two) times daily. Magic Cup with lunch and dinner   feeding supplement (PRO-STAT SUGAR FREE 64)Liqd Take 30 mLs by mouth 2 (two) times daily.   folic acid1 MG tablet Commonly known as: FOLVITE Take 1 mg by mouth daily.  iron polysaccharides150 MG capsule Commonly known as: NIFEREX Take 150 mg by mouth 2 (two) times daily.   mirtazapine30 MG tablet Commonly known as: REMERON Take 30 mg by mouth at bedtime.   multivitamin with mineralstablet Take 1 tablet by mouth daily.   polyethylene glycol17 g packet Commonly known as: MIRALAX / GLYCOLAX Take 17 g by mouth 2 (two) times daily as needed for mild constipation.   senna-docusate8.6-50 MG tablet Commonly known as: Senokot-S Take 2 tablets by mouth 2 (two) times daily.   thiamine100 MG tablet Take 100 mg by mouth  daily.   Vancomycin HCl in NaCl750-0.9 MG/150ML-% Soln Inject 750 mg into the vein every 8 (eight) hours.   vitamin B-121000 MCG tablet Commonly known as: CYANOCOBALAMIN Take 1,000 mcg by mouth daily.   vitamin C500 MG tablet Commonly known as: ASCORBIC ACID Take 500 mg by mouth 3 (three) times daily. With meals        .  Social History        Tobacco Use  . Smoking status: Current Every Day Smoker    Packs/day: 0.50    Types: Cigarettes  . Smokeless tobacco: Never Used  Substance Use Topics  . Alcohol use: Never    Frequency: Never    Review of systems.  In general she is not complaining of any fever or chills.  Again at times she does have a mildly elevated temperature.  Skin does not complain of rashes or itching she has the sacral ulcer as noted above.  Head ears eyes nose mouth and throat is not complain of visual changes or sore throat or difficulty swallowing.  Respiratory denies shortness of breath or cough or congestion.  Cardiac is not complaining of chest pain or edema.  GI does not complain of abdominal pain nausea vomiting diarrhea or constipation.  GU has an indwelling Foley catheter denies dysuria however.  Musculoskeletal has significant lower extremity weakness does not complain of pain at this time.  Neurologic is positive for weakness at one point complained of numbness but apparently this has improved some does not complain of dizziness or syncope.  And psych does not complain of being depressed or anxious continues to have a positive outlook   Physical exam.  Temperature is 99.4-pulse 94 respirations 18 blood pressure 94/61.   In general this is a frail elderly female in no distress lying comfortably in bed she continues to be pleasant and alert.  Her skin is warm and dry-sacral area is currently covered and was not assessed per notation by wound care this appears to be relatively stable without signs of  worsening infection or drainage-she is on vancomycin of note.  Oropharynx is clear mucous membranes moist.  Chest is clear to auscultation there is no labored breathing.  Heart is regular rate and rhythm without murmur gallop or rub she does not have significant lower extremity edema.  Abdomen continues to be soft nontender with positive bowel sounds.  Musculoskeletal significant for extensive lower extremity weakness moves her upper extremities at baseline PICC line right upper arm upper arm appears benign without concerning erythema or drainage.  Neurologic as noted above significant for lower extremity weakness her speech is clear could not appreciate lateralizing findings upper extremities cranial nerves appear to be intact.  Psych she is alert and oriented very pleasant and appropriate  Labs.  March 18, 2019.  Sodium 134 potassium 4.9 BUN 15.3 creatinine 0.29.  Albumin 2.8 otherwise liver function tests within normal limits.  WBC 8.6  hemoglobin 7.6 platelets 715.  Sed rate was 145-see reactive was nonreactive.  Prealbumin was 6.  Assessment and plan.  Intermittent elevated temperature-per chart review this has been occurring now for some time-she continues on vancomycin for polymicrobial infection of the lumbar spine with a sacral wound as well.  Her white count was not elevated on most recent labs she receives weekly labs that are sent to [facility] is not complaining of any overt fever chills dysuria chest congestion cough diarrhea.  At this point will monitor she appears to be at baseline.  2.  Anemia her hemoglobin tends to run from the sevens up to 9-does have some variability.  She is on iron-and this is followed at [facility] as well there is a significant element of chronic disease here.   #3 history of hypotension-this is intermittent as well and appears to be well-tolerated-systolic today is in the 0000000 baseline appears to be in  the 90s to lower 100s at this point will monitor-she is not on any antihypertensives or diuretic.  [id number]

## 2019-03-23 DIAGNOSIS — I10 Essential (primary) hypertension: Secondary | ICD-10-CM | POA: Diagnosis not present

## 2019-03-23 DIAGNOSIS — E43 Unspecified severe protein-calorie malnutrition: Secondary | ICD-10-CM | POA: Diagnosis not present

## 2019-03-24 ENCOUNTER — Encounter: Payer: Self-pay | Admitting: Internal Medicine

## 2019-03-25 DIAGNOSIS — D649 Anemia, unspecified: Secondary | ICD-10-CM | POA: Diagnosis not present

## 2019-03-25 DIAGNOSIS — L89154 Pressure ulcer of sacral region, stage 4: Secondary | ICD-10-CM | POA: Diagnosis not present

## 2019-03-25 DIAGNOSIS — M2550 Pain in unspecified joint: Secondary | ICD-10-CM | POA: Diagnosis not present

## 2019-03-25 DIAGNOSIS — E43 Unspecified severe protein-calorie malnutrition: Secondary | ICD-10-CM | POA: Diagnosis not present

## 2019-03-26 DIAGNOSIS — T8149XD Infection following a procedure, other surgical site, subsequent encounter: Secondary | ICD-10-CM | POA: Diagnosis not present

## 2019-03-30 ENCOUNTER — Telehealth: Payer: Self-pay | Admitting: Nurse Practitioner

## 2019-03-30 DIAGNOSIS — M2550 Pain in unspecified joint: Secondary | ICD-10-CM | POA: Diagnosis not present

## 2019-03-30 DIAGNOSIS — D649 Anemia, unspecified: Secondary | ICD-10-CM | POA: Diagnosis not present

## 2019-03-30 DIAGNOSIS — E43 Unspecified severe protein-calorie malnutrition: Secondary | ICD-10-CM | POA: Diagnosis not present

## 2019-03-30 LAB — CBC AND DIFFERENTIAL
HCT: 22 — AB (ref 36–46)
Hemoglobin: 7.1 — AB (ref 12.0–16.0)
Neutrophils Absolute: 5
Platelets: 810 — AB (ref 150–399)
WBC: 7.6

## 2019-03-30 LAB — POCT ERYTHROCYTE SEDIMENTATION RATE, NON-AUTOMATED: Sed Rate: 145

## 2019-03-30 LAB — CBC: RBC: 2.78 — AB (ref 3.87–5.11)

## 2019-03-30 NOTE — Telephone Encounter (Signed)
Called on 03/29/19 regarding temp of 101.9 after COVID vaccine which was given on 03/28/2019. Pt without any other symptoms. Per nursing pt is on doxycycline for wound. States the wound does not appear to have worsening infection. Will get CBC with diff to make sure white count is not elevated and nursing will continue to monitor temp and VS, otherwise VSS per nursing. To notify if symptoms occurs or temperature worsens. She was given tylenol at this time. Sent to PCP for [facility]

## 2019-04-01 ENCOUNTER — Telehealth: Payer: Self-pay | Admitting: Nurse Practitioner

## 2019-04-01 DIAGNOSIS — D649 Anemia, unspecified: Secondary | ICD-10-CM | POA: Diagnosis not present

## 2019-04-01 DIAGNOSIS — L89154 Pressure ulcer of sacral region, stage 4: Secondary | ICD-10-CM | POA: Diagnosis not present

## 2019-04-01 DIAGNOSIS — E43 Unspecified severe protein-calorie malnutrition: Secondary | ICD-10-CM | POA: Diagnosis not present

## 2019-04-01 DIAGNOSIS — I10 Essential (primary) hypertension: Secondary | ICD-10-CM | POA: Diagnosis not present

## 2019-04-01 DIAGNOSIS — M2550 Pain in unspecified joint: Secondary | ICD-10-CM | POA: Diagnosis not present

## 2019-04-01 LAB — CBC: RBC: 2.67 — AB (ref 3.87–5.11)

## 2019-04-01 LAB — HEPATIC FUNCTION PANEL
ALT: 5 — AB (ref 7–35)
AST: 10 — AB (ref 13–35)
Alkaline Phosphatase: 55 (ref 25–125)
Bilirubin, Total: 0.3

## 2019-04-01 LAB — CBC AND DIFFERENTIAL
HCT: 21 — AB (ref 36–46)
Hemoglobin: 6.9 — AB (ref 12.0–16.0)
Neutrophils Absolute: 6
Platelets: 770 — AB (ref 150–399)
WBC: 8.8

## 2019-04-01 LAB — BASIC METABOLIC PANEL
BUN: 8 (ref 4–21)
CO2: 26 — AB (ref 13–22)
Chloride: 100 (ref 99–108)
Creatinine: 0.3 — AB (ref 0.5–1.1)
Glucose: 88
Potassium: 4.3 (ref 3.4–5.3)
Sodium: 136 — AB (ref 137–147)

## 2019-04-01 LAB — COMPREHENSIVE METABOLIC PANEL
Albumin: 2.7 — AB (ref 3.5–5.0)
Calcium: 9.1 (ref 8.7–10.7)
GFR calc Af Amer: 90
GFR calc non Af Amer: 90
Globulin: 3.1

## 2019-04-01 LAB — POCT ERYTHROCYTE SEDIMENTATION RATE, NON-AUTOMATED: Sed Rate: 140

## 2019-04-01 NOTE — Telephone Encounter (Signed)
Staff called, reported the patient's Hgb 6.9, sated the patient has no active bleeding. Hx of anemia, baseline Hgb 7-9. The patient received COVID vaccine 03/28/19, had  T 101.9 03/29/19,  T 101.8 today. Wbc 8.8 today. The patient takes ? Doxy vs Vanco for sacral wound. Recommended to monitor VS q 4 hr x24 hours, monitor for s/s of bleeding. Will f/u provider in morning.

## 2019-04-02 ENCOUNTER — Encounter (HOSPITAL_COMMUNITY): Payer: Self-pay | Admitting: Emergency Medicine

## 2019-04-02 ENCOUNTER — Other Ambulatory Visit: Payer: Self-pay

## 2019-04-02 ENCOUNTER — Emergency Department (HOSPITAL_COMMUNITY)
Admission: EM | Admit: 2019-04-02 | Discharge: 2019-04-03 | Disposition: A | Discharge status: Home / Self Care | Payer: PPO | Attending: Emergency Medicine | Admitting: Emergency Medicine

## 2019-04-02 DIAGNOSIS — R9431 Abnormal electrocardiogram [ECG] [EKG]: Secondary | ICD-10-CM | POA: Diagnosis not present

## 2019-04-02 DIAGNOSIS — F17210 Nicotine dependence, cigarettes, uncomplicated: Secondary | ICD-10-CM | POA: Diagnosis not present

## 2019-04-02 DIAGNOSIS — D649 Anemia, unspecified: Secondary | ICD-10-CM | POA: Insufficient documentation

## 2019-04-02 DIAGNOSIS — R7889 Finding of other specified substances, not normally found in blood: Secondary | ICD-10-CM | POA: Diagnosis not present

## 2019-04-02 DIAGNOSIS — D500 Iron deficiency anemia secondary to blood loss (chronic): Secondary | ICD-10-CM | POA: Diagnosis not present

## 2019-04-02 DIAGNOSIS — Z79899 Other long term (current) drug therapy: Secondary | ICD-10-CM | POA: Diagnosis not present

## 2019-04-02 LAB — CBC WITH DIFFERENTIAL/PLATELET
Abs Immature Granulocytes: 0.04 10*3/uL (ref 0.00–0.07)
Basophils Absolute: 0.1 10*3/uL (ref 0.0–0.1)
Basophils Relative: 1 %
Eosinophils Absolute: 0.7 10*3/uL — ABNORMAL HIGH (ref 0.0–0.5)
Eosinophils Relative: 6 %
HCT: 23.7 % — ABNORMAL LOW (ref 36.0–46.0)
Hemoglobin: 7.1 g/dL — ABNORMAL LOW (ref 12.0–15.0)
Immature Granulocytes: 0 %
Lymphocytes Relative: 16 %
Lymphs Abs: 1.8 10*3/uL (ref 0.7–4.0)
MCH: 24.9 pg — ABNORMAL LOW (ref 26.0–34.0)
MCHC: 30 g/dL (ref 30.0–36.0)
MCV: 83.2 fL (ref 80.0–100.0)
Monocytes Absolute: 0.7 10*3/uL (ref 0.1–1.0)
Monocytes Relative: 7 %
Neutro Abs: 7.5 10*3/uL (ref 1.7–7.7)
Neutrophils Relative %: 70 %
Platelets: 772 10*3/uL — ABNORMAL HIGH (ref 150–400)
RBC: 2.85 MIL/uL — ABNORMAL LOW (ref 3.87–5.11)
RDW: 17.1 % — ABNORMAL HIGH (ref 11.5–15.5)
WBC: 10.7 10*3/uL — ABNORMAL HIGH (ref 4.0–10.5)
nRBC: 0 % (ref 0.0–0.2)

## 2019-04-02 LAB — COMPREHENSIVE METABOLIC PANEL
ALT: 8 U/L (ref 0–44)
AST: 23 U/L (ref 15–41)
Albumin: 2.5 g/dL — ABNORMAL LOW (ref 3.5–5.0)
Alkaline Phosphatase: 53 U/L (ref 38–126)
Anion gap: 11 (ref 5–15)
BUN: 9 mg/dL (ref 8–23)
CO2: 25 mmol/L (ref 22–32)
Calcium: 9 mg/dL (ref 8.9–10.3)
Chloride: 100 mmol/L (ref 98–111)
Creatinine, Ser: 0.36 mg/dL — ABNORMAL LOW (ref 0.44–1.00)
GFR calc Af Amer: >60 mL/min (ref >60)
GFR calc non Af Amer: >60 mL/min (ref >60)
Glucose, Bld: 110 mg/dL — ABNORMAL HIGH (ref 70–99)
Potassium: 4.5 mmol/L (ref 3.5–5.1)
Sodium: 136 mmol/L (ref 135–145)
Total Bilirubin: 0.5 mg/dL (ref 0.3–1.2)
Total Protein: 7.2 g/dL (ref 6.5–8.1)

## 2019-04-02 LAB — PREPARE RBC (CROSSMATCH)

## 2019-04-02 LAB — ABO/RH: ABO/RH(D): O POS

## 2019-04-02 MED ORDER — SODIUM CHLORIDE 0.9 % IV SOLN
10.0000 mL/h | Freq: Once | INTRAVENOUS | Status: AC
  Administered 2019-04-02: 10 mL/h via INTRAVENOUS

## 2019-04-02 NOTE — ED Triage Notes (Signed)
Patient arrived by EMS from [facility]. Patient was sent due to low Hgb.  Patient has no complaints.   Patient is Alert and Oriented x 4.

## 2019-04-02 NOTE — ED Notes (Signed)
Pt made aware of delay with [facility].

## 2019-04-02 NOTE — ED Provider Notes (Signed)
  Physical Exam  BP 112/63 (BP Location: Left Arm)   Pulse 97   Temp 98.6 F (37 C)   Resp 18   Ht 5' 7.5" (1.715 m)   Wt 46.8 kg   SpO2 100%   BMI 15.92 kg/m   Physical Exam  ED Course/Procedures     Procedures  MDM  Patient with anemia.  Acute on chronic.  Sent for transfusion.  Transfused.  Will discharge back.       Davonna Belling, MD 04/02/19 2045

## 2019-04-02 NOTE — ED Provider Notes (Addendum)
[facility] DEPT Provider Note   CSN: [id number] Arrival date & time: 04/02/19  1106     History Chief Complaint  Patient presents with  . Low Hemoglobin    Mary Frazier is a 74 y.o. female.  74 yo F with a chief complaint of anemia.  Looking at the patient's previous notes the patient has a baseline hemoglobin between 7 and 9 and her hemoglobin drawn at her facility was 6.9.  She was sent here for blood transfusion.  Patient denies any current complaints.  Has a chronic wound to her back that requires IV antibiotics that she thinks is doing slightly better.  Denies fevers.  The history is provided by the patient and the nursing home.  Illness Severity:  Mild Onset quality:  Gradual Duration:  1 day Timing:  Constant Progression:  Unchanged Chronicity:  New Associated symptoms: no chest pain, no congestion, no fever, no headaches, no myalgias, no nausea, no rhinorrhea, no shortness of breath, no vomiting and no wheezing        Past Medical History:  Diagnosis Date  . Alcohol abuse   . Demyelinating disorder ([place])   . Tobacco abuse     Patient Active Problem List   Diagnosis Date Noted  . Osteomyelitis of lumbar spine ([place]) 12/24/2018  . Sacral decubitus ulcer, stage IV ([place]) 12/24/2018  . Severe protein-calorie malnutrition Altamease Oiler: less than 60% of standard weight) ([place]) 12/24/2018  . Normocytic anemia 12/24/2018  . Vitamin D deficiency 12/24/2018  . Polymicrobial bacterial infection 12/21/2018  . Demyelinating disease of central nervous system ([place]) 12/02/2018  . Pressure ulcer, stage II ([place]) 12/02/2018  . Lumbar stenosis 11/24/2018  . Cervical stenosis of spine 11/24/2018  . Urinary retention 11/24/2018  . Tobacco abuse 11/24/2018  . Alcohol abuse 11/24/2018  . Hypokalemia 11/24/2018  . Hypomagnesemia 11/24/2018  . Hyponatremia 11/24/2018    History reviewed. No pertinent surgical history.   OB History   No obstetric history  on file.     No family history on file.  Social History   Tobacco Use  . Smoking status: Current Every Day Smoker    Packs/day: 0.50    Types: Cigarettes  . Smokeless tobacco: Never Used  Substance Use Topics  . Alcohol use: Never  . Drug use: Never    Home Medications Prior to Admission medications   Medication Sig Start Date End Date Taking? Authorizing Provider  acetaminophen (TYLENOL) 500 MG tablet Take 1,000 mg by mouth every 8 (eight) hours as needed for mild pain or headache.    Yes [provider]  Amino Acids-Protein Hydrolys (FEEDING SUPPLEMENT, PRO-STAT SUGAR FREE 64,) LIQD Take 30 mLs by mouth 2 (two) times daily.   Yes [provider]  calcium carbonate (TUMS - DOSED IN MG ELEMENTAL CALCIUM) 500 MG chewable tablet Chew 500 mg by mouth daily.    Yes [provider]  Cholecalciferol (D3-1000) 25 MCG (1000 UT) capsule Take 1,000 Units by mouth daily.   Yes [provider]  folic acid (FOLVITE) 1 MG tablet Take 1 mg by mouth daily.   Yes [provider]  iron polysaccharides (NIFEREX) 150 MG capsule Take 150 mg by mouth 2 (two) times daily. 12 pm and 1800   Yes [provider]  mirtazapine (REMERON) 30 MG tablet Take 30 mg by mouth at bedtime.   Yes [provider]  Multiple Vitamins-Minerals (MULTIVITAMIN WITH MINERALS) tablet Take 1 tablet by mouth daily.   Yes [provider]  Nutritional Supplements (ENSURE ENLIVE PO) Take 237 mLs by mouth 3 (three) times daily.   Yes [provider]  Nutritional Supplements (NUTRITIONAL SUPPLEMENT PO) Take 1 each by mouth 2 (two) times daily. Magic Cup with lunch and dinner   Yes [provider]  polyethylene glycol (MIRALAX / GLYCOLAX) 17 g packet Take 17 g by mouth 2 (two) times daily as needed for mild constipation.   Yes [provider]  senna-docusate (SENOKOT-S) 8.6-50 MG tablet Take 2 tablets by mouth 2 (two) times daily.   Yes  [provider]  thiamine 100 MG tablet Take 100 mg by mouth daily.   Yes [provider]  Vancomycin HCl in NaCl 750-0.9 MG/150ML-% SOLN Inject 750 mg into the vein every 12 (twelve) hours.  02/09/19  Yes [provider]  vitamin B-12 (CYANOCOBALAMIN) 1000 MCG tablet Take 1,000 mcg by mouth daily.   Yes [provider]  vitamin C (ASCORBIC ACID) 500 MG tablet Take 500 mg by mouth 3 (three) times daily. With meals   Yes [provider]    Allergies    Patient has no known allergies.  Review of Systems   Review of Systems  Constitutional: Negative for chills and fever.  HENT: Negative for congestion and rhinorrhea.   Eyes: Negative for redness and visual disturbance.  Respiratory: Negative for shortness of breath and wheezing.   Cardiovascular: Negative for chest pain and palpitations.  Gastrointestinal: Negative for nausea and vomiting.  Genitourinary: Negative for dysuria and urgency.  Musculoskeletal: Negative for arthralgias and myalgias.  Skin: Negative for pallor and wound.  Neurological: Negative for dizziness and headaches.    Physical Exam Updated Vital Signs BP 98/60   Pulse 93   Temp 98.6 F (37 C)   Resp 18   Ht 5' 7.5" (1.715 m)   Wt 46.8 kg   SpO2 100%   BMI 15.92 kg/m   Physical Exam Vitals and nursing note reviewed.  Constitutional:      General: She is not in acute distress.    Appearance: She is cachectic. She is not diaphoretic.     Comments: Cachectic, chronically ill-appearing  HENT:     Head: Normocephalic and atraumatic.  Eyes:     Pupils: Pupils are equal, round, and reactive to light.  Cardiovascular:     Rate and Rhythm: Normal rate and regular rhythm.     Heart sounds: No murmur. No friction rub. No gallop.   Pulmonary:     Effort: Pulmonary effort is normal.     Breath sounds: No wheezing or rales.  Abdominal:     General: There is no distension.     Palpations: Abdomen is soft.      Tenderness: There is no abdominal tenderness.  Musculoskeletal:        General: No tenderness.     Cervical back: Normal range of motion and neck supple.  Skin:    General: Skin is warm and dry.  Neurological:     Mental Status: She is alert and oriented to person, place, and time.  Psychiatric:        Behavior: Behavior normal.     ED Results / Procedures / Treatments   Labs (all labs ordered are listed, but only abnormal results are displayed) Labs Reviewed  CBC WITH DIFFERENTIAL/PLATELET - Abnormal; Notable for the following components:      Result Value   WBC 10.7 (*)    RBC 2.85 (*)    Hemoglobin  7.1 (*)    HCT 23.7 (*)    MCH 24.9 (*)    RDW 17.1 (*)    Platelets 772 (*)    Eosinophils Absolute 0.7 (*)    All other components within normal limits  COMPREHENSIVE METABOLIC PANEL - Abnormal; Notable for the following components:   Glucose, Bld 110 (*)    Creatinine, Ser 0.36 (*)    Albumin 2.5 (*)    All other components within normal limits  TYPE AND SCREEN  ABO/RH  PREPARE RBC (CROSSMATCH)    EKG None  Radiology No results found.  Procedures Procedures (including critical care time)  Medications Ordered in ED Medications  0.9 %  sodium chloride infusion (10 mL/hr Intravenous New Bag/Given 04/02/19 1507)    ED Course  I have reviewed the triage vital signs and the nursing notes.  Pertinent labs & imaging results that were available during my care of the patient were reviewed by me and considered in my medical decision making (see chart for details).    MDM Rules/Calculators/A&P                      74 yo F with a chief complaints of acute on chronic anemia.  Hemoglobin at the facility in which she will have this was 6.9.  Per the most recent note she generally runs between 7 and 9.  We will recheck hemoglobin here.  Hemoglobin is 7.1 fairly consistent with a 6.9 at his facility.  As they felt that she needed blood we will go ahead and transfuse now.   Once done we will send back to the facility.  CRITICAL CARE Performed by: Cecilio Asper   Total critical care time: 35 minutes  Critical care time was exclusive of separately billable procedures and treating other patients.  Critical care was necessary to treat or prevent imminent or life-threatening deterioration.  Critical care was time spent personally by me on the following activities: development of treatment plan with patient and/or surrogate as well as nursing, discussions with consultants, evaluation of patient's response to treatment, examination of patient, obtaining history from patient or surrogate, ordering and performing treatments and interventions, ordering and review of laboratory studies, ordering and review of radiographic studies, pulse oximetry and re-evaluation of patient's condition.   The patients results and plan were reviewed and discussed.   Any x-rays performed were independently reviewed by myself.   Differential diagnosis were considered with the presenting HPI.  Medications  0.9 %  sodium chloride infusion (10 mL/hr Intravenous New Bag/Given 04/02/19 1507)    Vitals:   04/02/19 1135 04/02/19 1141 04/02/19 1459 04/02/19 1500  BP: 123/61  98/60 98/60  Pulse: 94  93 93  Resp: 16  15 18   Temp:  97.6 F (36.4 C) 98.6 F (37 C) 98.6 F (37 C)  TempSrc:  Oral    SpO2: 100%  100%   Weight:      Height:        Final diagnoses:  Acute on chronic anemia      Final Clinical Impression(s) / ED Diagnoses Final diagnoses:  Acute on chronic anemia    Rx / DC Orders ED Discharge Orders    None       Deno Etienne, DO 04/02/19 [place], Riley, DO 04/17/19 0038

## 2019-04-02 NOTE — ED Notes (Signed)
[facility] called for transportation  

## 2019-04-02 NOTE — Discharge Instructions (Signed)
Return for weakness.  Pain, fever

## 2019-04-03 DIAGNOSIS — R279 Unspecified lack of coordination: Secondary | ICD-10-CM | POA: Diagnosis not present

## 2019-04-03 DIAGNOSIS — Z743 Need for continuous supervision: Secondary | ICD-10-CM | POA: Diagnosis not present

## 2019-04-03 DIAGNOSIS — R5381 Other malaise: Secondary | ICD-10-CM | POA: Diagnosis not present

## 2019-04-03 LAB — TYPE AND SCREEN
ABO/RH(D): O POS
Antibody Screen: NEGATIVE
Unit division: 0

## 2019-04-03 LAB — BPAM RBC
Blood Product Expiration Date: 202102032359
ISSUE DATE / TIME: 202101051438
Unit Type and Rh: 5100

## 2019-04-04 ENCOUNTER — Encounter: Payer: Self-pay | Admitting: Internal Medicine

## 2019-04-04 ENCOUNTER — Non-Acute Institutional Stay (SKILLED_NURSING_FACILITY): Payer: PPO | Admitting: Internal Medicine

## 2019-04-04 DIAGNOSIS — M4626 Osteomyelitis of vertebra, lumbar region: Secondary | ICD-10-CM

## 2019-04-04 DIAGNOSIS — D649 Anemia, unspecified: Secondary | ICD-10-CM | POA: Diagnosis not present

## 2019-04-04 DIAGNOSIS — E43 Unspecified severe protein-calorie malnutrition: Secondary | ICD-10-CM

## 2019-04-04 DIAGNOSIS — I10 Essential (primary) hypertension: Secondary | ICD-10-CM | POA: Diagnosis not present

## 2019-04-04 NOTE — Progress Notes (Signed)
Location:  [facility] Room Number: 507-P Place of Service:  SNF [phone]) Provider:  Granville Lewis  Patient Care Team: [name], MD as PCP - General (Internal Medicine)  Extended Emergency Contact Information Primary Emergency Contact: Alycia Rossetti Mobile Phone: [phone] Relation: Aunt  Code Status:  DNR Goals of care: Advanced Directive information Advanced Directives 04/04/2019  Does Patient Have a Medical Advance Directive? Yes  Type of Advance Directive Out of facility DNR (pink MOST or yellow form)  Does patient want to make changes to medical advance directive? No - Patient declined  Pre-existing out of facility DNR order (yellow form or pink MOST form) Yellow form placed in chart (order not valid for inpatient use)     Chief Complaint  Patient presents with  . Hospitalization Follow-up    Patient seen status post ED visit for acute on chronic anemia.     HPI:  Pt is a 74 y.o. female seen today for an acute visit for follow-up of anemia.  Patient is here for IV antibiotics with a history of polymicrobial infection of lumbar spine continues on IV vancomycin.  She is followed by infectious disease at [facility].  She had a lumbar laminectomy and a cervical laminectomy also wound repair on the lumbar spine along with repair of a dural leak-subsequently-- she had infection and reirrigation of the wound and had a wound VAC.  Cultures grew out E. coli Proteus and Enterococcus as well as Morganella she was started on IV ampicillin and Cipro and has completed a course---- she also completed course of Flagyl.  She also has significant failure to thrive with protein calorie malnutrition. And has chronic on top of acute anemia.  Hemoglobin recently has been more in the sevens--eights  range but it was 6.9 on the lab done earlier this week and she was sent to the ER for transfusion which she receives-appears her hemoglobin in the ER  was 7.1  She is also on iron of note.  She has returned to the facility and appears to be at her baseline today does have weakness at baseline but she does not really have any acute complaints.  She does have weekly labs drawn with results sent to infectious disease at [facility].    Past Medical History:  Diagnosis Date  . Alcohol abuse   . Demyelinating disorder ([place])   . Tobacco abuse    No past surgical history on file.  No Known Allergies  Outpatient Encounter Medications as of 04/04/2019  Medication Sig  . acetaminophen (TYLENOL) 500 MG tablet Take 1,000 mg by mouth every 8 (eight) hours as needed for mild pain or headache.   . Amino Acids-Protein Hydrolys (FEEDING SUPPLEMENT, PRO-STAT SUGAR FREE 64,) LIQD Take 30 mLs by mouth 2 (two) times daily.  . calcium carbonate (TUMS - DOSED IN MG ELEMENTAL CALCIUM) 500 MG chewable tablet Chew 500 mg by mouth daily.   . Cholecalciferol (D3-1000) 25 MCG (1000 UT) capsule Take 1,000 Units by mouth daily.  . folic acid (FOLVITE) 1 MG tablet Take 1 mg by mouth daily.  . iron polysaccharides (NIFEREX) 150 MG capsule Take 150 mg by mouth 2 (two) times daily. 12 pm and 1800  . mirtazapine (REMERON) 30 MG tablet Take 30 mg by mouth at bedtime.  . Multiple Vitamins-Minerals (MULTIVITAMIN WITH MINERALS) tablet Take 1 tablet by mouth daily.  . Nutritional Supplements (ENSURE ENLIVE PO) Take 237 mLs by mouth 3 (three) times daily.  Marland Kitchen  Nutritional Supplements (NUTRITIONAL SUPPLEMENT PO) Take 1 each by mouth 2 (two) times daily. Magic Cup with lunch and dinner  . polyethylene glycol (MIRALAX / GLYCOLAX) 17 g packet Take 17 g by mouth 2 (two) times daily as needed for mild constipation.  . senna-docusate (SENOKOT-S) 8.6-50 MG tablet Take 2 tablets by mouth 2 (two) times daily.  Marland Kitchen thiamine 100 MG tablet Take 100 mg by mouth daily.  . Vancomycin HCl in NaCl 750-0.9 MG/150ML-% SOLN Inject 750 mg into the vein every 12 (twelve) hours.   . vitamin B-12  (CYANOCOBALAMIN) 1000 MCG tablet Take 1,000 mcg by mouth daily.  . vitamin C (ASCORBIC ACID) 500 MG tablet Take 500 mg by mouth 3 (three) times daily. With meals   No facility-administered encounter medications on file as of 04/04/2019.    Review of Systems General she is not complaining of any fever or chills.  Skin does not complain of rashes or itching does have a sacral ulcer Is being treated with vancomycin.  Head ears eyes nose mouth and throat does not complain of visual changes sore throat or difficulty swallowing.  Respiratory does not complain of being short of breath or having a cough.  Paracardiac no complaints of chest pain or increased edema.  GI does not complain of abdominal pain nausea vomiting diarrhea or constipation-says she has a spotty appetite depending on the food.  GU does not complain of dysuria has an indwelling Foley catheter.  Musculoskeletal continues with significant weakness especially of her lower extremities does not complain of pain currently.  Neurologic again she does have weakness does not complain of syncope or increased numbness at this time.  Psych does not complain of depression or anxiety-  There is no immunization history on file for this patient. Pertinent  Health Maintenance Due  Topic Date Due  . MAMMOGRAM  07/29/1996  . COLONOSCOPY  07/29/1996  . DEXA SCAN  07/30/2011  . INFLUENZA VACCINE  06/26/2019 (Originally 10/27/2018)  . PNA vac Low Risk Adult (1 of 2 - PCV13) 03/10/2020 (Originally 07/30/2011)   Fall Risk  10/26/2018  Falls in the past year? (No Data)  Comment Emmi Telephone Survey: data to providers prior to load  Number falls in past yr: (No Data)  Comment Emmi Telephone Survey Actual Response =    Functional Status Survey:    Vitals:   04/04/19 1226  BP: 116/63  Pulse: 88  Resp: 16  Temp: (!) 96.7 F (35.9 C)  TempSrc: Oral  Weight: 103 lb 9.6 oz (47 kg)  Height: 5\' 8"  (1.727 m)   Body mass index is 15.75  kg/m. Physical Exam   In general this is a pleasant elderly female she appears quite frail but at baseline she is bright and alert.  Her skin is warm and dry sacral ulcer currently covered and was not assessed this is followed by wound care.  Eyes visual acuity appears to be intact sclera and conjunctive are clear.  Oropharynx is clear mucous membranes moist.  Chest is clear to auscultation there is no labored breathing.  Heart is regular rate and rhythm without murmur gallop or rub she does not have significant edema.  Abdomen is soft nontender with positive bowel sounds.  GU has an indwelling Foley catheter draining amber-colored urine  Musculoskeletal continues with extensive lower extremity weakness she does not have significant edema does move her upper extremities at baseline.  Neurologic appears grossly intact with lower extremity weakness her speech is clear cranial nerves appear  to be intact.  Psych she is alert and oriented pleasant and appropriate.  This  Labs reviewed: Recent Labs    03/04/19 0000 03/05/19 0000 04/02/19 1205  NA 137 140 136  K 5.7* 4.8 4.5  CL 98* 101 100  CO2 23* 28* 25  GLUCOSE  --   --  110*  BUN 14 17 9   CREATININE 0.3* 0.3* 0.36*  CALCIUM 8.9 8.9 9.0   Recent Labs    02/20/19 0000 03/04/19 0000 04/02/19 1205  AST 12* 20 23  ALT 6* 9 8  ALKPHOS 59 57 53  BILITOT  --   --  0.5  PROT  --   --  7.2  ALBUMIN 3.0* 3.1* 2.5*   Recent Labs    02/20/19 0000 03/04/19 0000 04/02/19 1205  WBC 6.4 8.0 10.7*  NEUTROABS 3 4 7.5  HGB 8.6* 8.8* 7.1*  HCT 26* 27* 23.7*  MCV  --   --  83.2  PLT 512* 596* 772*   No results found for: TSH No results found for: HGBA1C No results found for: CHOL, HDL, LDLCALC, LDLDIRECT, TRIG, CHOLHDL  Significant Diagnostic Results in last 30 days:  No results found.  Assessment/Plan  #1 acute on chronic anemia-she is status post transfusion in the hospital-she does get weekly labs will await  weekly lab which should be drawn on Monday-recent hemoglobins have been more in the sevens but had dropped--prompting the transfusion.  She continues on iron-.  2.  Failure to thrive-patient is on Remeron for appetite stimulation she is also on numerous supplements including Magic cup and told staff-she also continues on thiamine.  Her weight appears to be stabilized during the month of December but this will have to be watched and continues to be a challenge.  Metabolic panel has been reassuring showing stable renal function and electrolytes-at this point continue supportive care supplements and encouragement to eat which I did speak to her about again today.  .  #3 history of polymicrobial infection with osteomyelitis-she continues on vancomycin and this has been ordered and followed by [facility] this point continue to monitor with emphasis on making sure she is comfortable and she does not really complain of pain she is on Tylenol as needed and at this point appears to be adequate.  [place], PA-C [phone]

## 2019-04-07 ENCOUNTER — Encounter: Payer: Self-pay | Admitting: Internal Medicine

## 2019-04-08 DIAGNOSIS — E43 Unspecified severe protein-calorie malnutrition: Secondary | ICD-10-CM | POA: Diagnosis not present

## 2019-04-08 DIAGNOSIS — M2550 Pain in unspecified joint: Secondary | ICD-10-CM | POA: Diagnosis not present

## 2019-04-08 DIAGNOSIS — D649 Anemia, unspecified: Secondary | ICD-10-CM | POA: Diagnosis not present

## 2019-04-08 LAB — BASIC METABOLIC PANEL
BUN: 7 (ref 4–21)
CO2: 25 — AB (ref 13–22)
Chloride: 103 (ref 99–108)
Creatinine: 0.3 — AB (ref 0.5–1.1)
Glucose: 87
Potassium: 3.9 (ref 3.4–5.3)
Sodium: 138 (ref 137–147)

## 2019-04-08 LAB — CBC AND DIFFERENTIAL
HCT: 24 — AB (ref 36–46)
Hemoglobin: 7.9 — AB (ref 12.0–16.0)
Neutrophils Absolute: 6
Platelets: 558 — AB (ref 150–399)
WBC: 8.9

## 2019-04-08 LAB — COMPREHENSIVE METABOLIC PANEL
Albumin: 2.4 — AB (ref 3.5–5.0)
Calcium: 8.5 — AB (ref 8.7–10.7)
GFR calc Af Amer: 90
GFR calc non Af Amer: 90
Globulin: 3.2

## 2019-04-08 LAB — POCT ERYTHROCYTE SEDIMENTATION RATE, NON-AUTOMATED: Sed Rate: 120

## 2019-04-08 LAB — HEPATIC FUNCTION PANEL
ALT: 5 — AB (ref 7–35)
AST: 13 (ref 13–35)
Alkaline Phosphatase: 48 (ref 25–125)
Bilirubin, Total: 0.2

## 2019-04-08 LAB — CBC: RBC: 3.04 — AB (ref 3.87–5.11)

## 2019-04-11 DIAGNOSIS — E43 Unspecified severe protein-calorie malnutrition: Secondary | ICD-10-CM | POA: Diagnosis not present

## 2019-04-11 DIAGNOSIS — D649 Anemia, unspecified: Secondary | ICD-10-CM | POA: Diagnosis not present

## 2019-04-11 LAB — CBC AND DIFFERENTIAL
HCT: 24 — AB (ref 36–46)
Hemoglobin: 7.7 — AB (ref 12.0–16.0)
Neutrophils Absolute: 7
Platelets: 627 — AB (ref 150–399)
WBC: 10.1

## 2019-04-11 LAB — CBC: RBC: 3.03 — AB (ref 3.87–5.11)

## 2019-04-16 DIAGNOSIS — M2550 Pain in unspecified joint: Secondary | ICD-10-CM | POA: Diagnosis not present

## 2019-04-16 DIAGNOSIS — E43 Unspecified severe protein-calorie malnutrition: Secondary | ICD-10-CM | POA: Diagnosis not present

## 2019-04-16 DIAGNOSIS — D649 Anemia, unspecified: Secondary | ICD-10-CM | POA: Diagnosis not present

## 2019-04-16 LAB — COMPREHENSIVE METABOLIC PANEL
Albumin: 2.5 — AB (ref 3.5–5.0)
Calcium: 8.4 — AB (ref 8.7–10.7)
GFR calc Af Amer: 90
GFR calc non Af Amer: 90
Globulin: 2.9

## 2019-04-16 LAB — BASIC METABOLIC PANEL
BUN: 5 (ref 4–21)
CO2: 28 — AB (ref 13–22)
Chloride: 101 (ref 99–108)
Creatinine: 0.3 — AB (ref 0.5–1.1)
Glucose: 128
Potassium: 4.1 (ref 3.4–5.3)
Sodium: 138 (ref 137–147)

## 2019-04-16 LAB — CBC AND DIFFERENTIAL
HCT: 23 — AB (ref 36–46)
Hemoglobin: 7.4 — AB (ref 12.0–16.0)
Neutrophils Absolute: 6
Platelets: 711 — AB (ref 150–399)
WBC: 9

## 2019-04-16 LAB — CBC: RBC: 2.93 — AB (ref 3.87–5.11)

## 2019-04-16 LAB — HEPATIC FUNCTION PANEL
ALT: 5 — AB (ref 7–35)
AST: 10 — AB (ref 13–35)
Alkaline Phosphatase: 56 (ref 25–125)
Bilirubin, Total: 0.2

## 2019-04-16 LAB — POCT ERYTHROCYTE SEDIMENTATION RATE, NON-AUTOMATED: Sed Rate: 111

## 2019-04-17 ENCOUNTER — Encounter: Payer: Self-pay | Admitting: Internal Medicine

## 2019-04-17 ENCOUNTER — Non-Acute Institutional Stay (SKILLED_NURSING_FACILITY): Payer: PPO | Admitting: Internal Medicine

## 2019-04-17 DIAGNOSIS — L89154 Pressure ulcer of sacral region, stage 4: Secondary | ICD-10-CM | POA: Diagnosis not present

## 2019-04-17 DIAGNOSIS — E43 Unspecified severe protein-calorie malnutrition: Secondary | ICD-10-CM | POA: Diagnosis not present

## 2019-04-17 DIAGNOSIS — M4626 Osteomyelitis of vertebra, lumbar region: Secondary | ICD-10-CM | POA: Diagnosis not present

## 2019-04-17 DIAGNOSIS — D649 Anemia, unspecified: Secondary | ICD-10-CM

## 2019-04-17 NOTE — Progress Notes (Signed)
Location:    [facility] Room Number: 503/P Place of Service:  SNF (31) Provider:  Leda Min, MD  Patient Care Team: [name], MD as PCP - General (Internal Medicine)  Extended Emergency Contact Information Primary Emergency Contact: Alycia Rossetti Mobile Phone: [phone] Relation: Aunt  Code Status:  DNR Goals of care: Advanced Directive information Advanced Directives 04/17/2019  Does Patient Have a Medical Advance Directive? Yes  Type of Advance Directive Out of facility DNR (pink MOST or yellow form)  Does patient want to make changes to medical advance directive? No - Patient declined  Pre-existing out of facility DNR order (yellow form or pink MOST form) Yellow form placed in chart (order not valid for inpatient use)     Chief Complaint  Patient presents with  . Medical Management of Chronic Issues    Routine visit of medical management  Medical management of chronic medical conditions including history of polymicrobial infection with osteomyelitis-failure to thrive-anemia-history of sacral ulcer-generalized debility  HPI:  Pt is a 74 y.o. female seen today for medical management of chronic diseases as noted above She does not really have any complaints today-her weight is relatively stable although she continues to be quite frail-staff is encouraging p.o. intake strongly which she states she is doing although staff tends to feel she is not eating that much at times.  She is on numerous supplements including prostat.  And continues on Remeron for appetite stimulation  She does have a history of a polymicrobial. Infection of the lumbar spine-she had been on IV vancomycin but apparently this infection has improved a-she is followed by infectious disease at [facility].  Her vancomycin has been discontinued.  She continues to have a sacral wound which is not improving-Dr. Sheppard Coil is assessing this and  considering an alternative antibiotic.  She does not really complain of pain.  She recently received a transfusion in the hospital secondary to a hemoglobin of 6.9-she continues on iron.  As well as B12 supplementation.  She also is on thiamine with a previous history of alcohol abuse.    Hemoglobin on January 19 was 7.4 she gets weekly labs.  Currently she is lying in bed she does not appear to be uncomfortable is bright and alert--she does continue with a urinary catheter secondary to a history of retention.   .     Past Medical History:  Diagnosis Date  . Alcohol abuse   . Demyelinating disorder ([place])   . Tobacco abuse    No past surgical history on file.  No Known Allergies  Allergies as of 04/17/2019   No Known Allergies     Medication List       Accurate as of April 17, 2019  3:42 PM. If you have any questions, ask your nurse or doctor.        acetaminophen 500 MG tablet Commonly known as: TYLENOL Take 1,000 mg by mouth every 8 (eight) hours as needed for mild pain or headache.   calcium carbonate 500 MG chewable tablet Commonly known as: TUMS - dosed in mg elemental calcium Chew 500 mg by mouth daily.   D3-1000 25 MCG (1000 UT) capsule Generic drug: Cholecalciferol Take 1,000 Units by mouth daily.   ENSURE ENLIVE PO Take 237 mLs by mouth 3 (three) times daily.   NUTRITIONAL SUPPLEMENT PO Take 1 each by mouth 2 (two) times daily. Magic Cup with lunch and dinner   feeding supplement (PRO-STAT SUGAR  FREE 64) Liqd Take 30 mLs by mouth 2 (two) times daily.   folic acid 1 MG tablet Commonly known as: FOLVITE Take 1 mg by mouth daily.   iron polysaccharides 150 MG capsule Commonly known as: NIFEREX Take 150 mg by mouth 2 (two) times daily. 12 pm and 1800   mirtazapine 30 MG tablet Commonly known as: REMERON Take 30 mg by mouth at bedtime.   multivitamin with minerals tablet Take 1 tablet by mouth daily.   NON FORMULARY Diet order: Regular  Diet.   NORMAL SALINE FLUSH IV FLUSH PICC LINE WITH 10ML NORMAL SALINE BEFORE ADMINISTRATION OF INTRAVENEOUSLY ABT IV   polyethylene glycol 17 g packet Commonly known as: MIRALAX / GLYCOLAX Take 17 g by mouth 2 (two) times daily as needed for mild constipation.   senna-docusate 8.6-50 MG tablet Commonly known as: Senokot-S Take 2 tablets by mouth 2 (two) times daily.   thiamine 100 MG tablet Take 100 mg by mouth daily.   Vancomycin HCl in NaCl 750-0.9 MG/150ML-% Soln Inject 750 mg into the vein every 12 (twelve) hours.   vitamin B-12 1000 MCG tablet Commonly known as: CYANOCOBALAMIN Take 1,000 mcg by mouth daily.   vitamin C 500 MG tablet Commonly known as: ASCORBIC ACID Take 500 mg by mouth 3 (three) times daily. With meals       Review of Systems In general she is not complaining of any fever or chills.  Skin does not complain of rashes or itching.  She does have a concerning sacral wound as noted above  Head ears eyes nose mouth and throat is not complain of visual changes or sore throat.  Respiratory does not complain of being short of breath or having a cough.  Cardiac does not complain of chest pain or edema.  GI does not really complain of any abdominal pain or dysphagia-has a spotty appetite per staff although she thinks she is eating okay.  GU does have an indwelling Foley catheter does not complain of dysuria.  Musculoskeletal has significant lower extremity weakness does not complain of pain at this time.  Neurologic again positive for weakness lower extremities does not complain of headache dizziness or numbness at this point.  And psych does not really complain of being depressed or anxious-she remains hopeful that she will be able to walk again-although this appears to be quite challenging  There is no immunization history on file for this patient. Pertinent  Health Maintenance Due  Topic Date Due  . MAMMOGRAM  07/29/1996  . COLONOSCOPY  07/29/1996   . DEXA SCAN  07/30/2011  . INFLUENZA VACCINE  06/26/2019 (Originally 10/27/2018)  . PNA vac Low Risk Adult (1 of 2 - PCV13) 03/10/2020 (Originally 07/30/2011)   Fall Risk  10/26/2018  Falls in the past year? (No Data)  Comment Emmi Telephone Survey: data to providers prior to load  Number falls in past yr: (No Data)  Comment Emmi Telephone Survey Actual Response =    Functional Status Survey:    Vitals:   04/17/19 1516  BP: 109/62  Pulse: 70  Resp: 18  Temp: 98 F (36.7 C)  TempSrc: Oral  SpO2: 100%  Weight: 103 lb 9.6 oz (47 kg)  Height: 5\' 8"  (1.727 m)   Body mass index is 15.75 kg/m. Physical Exam   In general this is a pleasant elderly female she appears weak but at baseline she is bright and alert.  Her skin is warm and dry sacral ulcer is currently covered  and not assessed secondary to patient positioning.  Eyes visual acuity appears intact sclera and conjunctive are clear.  Oropharynx is clear mucous membranes moist.  Chest is clear to auscultation with somewhat shallow air entry there is no labored breathing.  Heart is regular rate and rhythm without murmur gallop or rub she does not have significant lower extremity edema.  Her abdomen is soft nontender with positive bowel sounds.  GU has an indwelling Foley catheter it is draining amber-colored urine.  Musculoskeletal has extensive lower extremity weakness hold her lower extremities in somewhat of a contracted position does not have significant edema moves her upper extremities at baseline.  Neurologic is grossly intact upper extremities has continued lower extremity weakness her speech is clear cranial nerves appear to be intact.  Psych she continues to be alert and oriented pleasant and appropriate.    Labs reviewed:  April 16, 2019.  Sodium 138 potassium 4.1 BUN 5 creatinine 0.29.  Albumin 2.5 otherwise liver function tests within normal limits.  WBC 9.0 hemoglobin 7.4 platelets 711.   Recent  Labs    04/01/19 0000 04/02/19 1205 04/08/19 0000  NA 136* 136 138  K 4.3 4.5 3.9  CL 100 100 103  CO2 26* 25 25*  GLUCOSE  --  110*  --   BUN 8 9 7   CREATININE 0.3* 0.36* 0.3*  CALCIUM 9.1 9.0 8.5*   Recent Labs    04/01/19 0000 04/02/19 1205 04/08/19 0000  AST 10* 23 13  ALT 5* 8 5*  ALKPHOS 55 53 48  BILITOT  --  0.5  --   PROT  --  7.2  --   ALBUMIN 2.7* 2.5* 2.4*   Recent Labs    04/02/19 1205 04/08/19 0000 04/11/19 0000  WBC 10.7* 8.9 10.1  NEUTROABS 7.5 6 7   HGB 7.1* 7.9* 7.7*  HCT 23.7* 24* 24*  MCV 83.2  --   --   PLT 772* 558* 627*   No results found for: TSH No results found for: HGBA1C No results found for: CHOL, HDL, LDLCALC, LDLDIRECT, TRIG, CHOLHDL  Significant Diagnostic Results in last 30 days:  No results found.  Assessment/Plan     History of acute on chronic anemia-she is status post transfusion she continues on iron and B12 supplementation last hemoglobin was 7.4-she does have serial labs ordered will monitor.  2.  Failure to thrive-she continues on Remeron for appetite stimulation as well as supplementation-she is also on thiamine with a history of alcohol abuse.  Weight recently appears to have stabilized but her appetite still is quite spotty-staff continues to encourage p.o. intake this continues to be a challenging issue especially with her coexistent sacral wound.  3.  History of polymicrobial infection with osteomyelitis-he has completed a course of vancomycin-continues with extensive lower extremity weakness.  Her pain appears to be controlled.  She does have orders for Tylenol.  4.  History of sacral wound this is thought to be stage IV and not improving-with her poor nutritional status this is challenging-Dr. Sheppard Coil is considering alternative antibiotic therapy.  She is not really complaining of pain in this regards.  [id number]

## 2019-04-19 ENCOUNTER — Encounter: Payer: Self-pay | Admitting: Internal Medicine

## 2019-04-22 DIAGNOSIS — L89890 Pressure ulcer of other site, unstageable: Secondary | ICD-10-CM | POA: Diagnosis not present

## 2019-04-22 DIAGNOSIS — L89223 Pressure ulcer of left hip, stage 3: Secondary | ICD-10-CM | POA: Diagnosis not present

## 2019-04-22 DIAGNOSIS — E43 Unspecified severe protein-calorie malnutrition: Secondary | ICD-10-CM | POA: Diagnosis not present

## 2019-04-22 DIAGNOSIS — L89510 Pressure ulcer of right ankle, unstageable: Secondary | ICD-10-CM | POA: Diagnosis not present

## 2019-04-22 DIAGNOSIS — M2550 Pain in unspecified joint: Secondary | ICD-10-CM | POA: Diagnosis not present

## 2019-04-22 DIAGNOSIS — L89154 Pressure ulcer of sacral region, stage 4: Secondary | ICD-10-CM | POA: Diagnosis not present

## 2019-04-22 DIAGNOSIS — J069 Acute upper respiratory infection, unspecified: Secondary | ICD-10-CM | POA: Diagnosis not present

## 2019-04-22 DIAGNOSIS — D649 Anemia, unspecified: Secondary | ICD-10-CM | POA: Diagnosis not present

## 2019-04-22 LAB — COMPREHENSIVE METABOLIC PANEL
Albumin: 2.6 — AB (ref 3.5–5.0)
Calcium: 8.7 (ref 8.7–10.7)
GFR calc Af Amer: 90
GFR calc non Af Amer: 90
Globulin: 3.3

## 2019-04-22 LAB — HEPATIC FUNCTION PANEL
ALT: 5 — AB (ref 7–35)
AST: 9 — AB (ref 13–35)
Alkaline Phosphatase: 59 (ref 25–125)
Bilirubin, Total: 0.2

## 2019-04-22 LAB — CBC AND DIFFERENTIAL
HCT: 22 — AB (ref 36–46)
Hemoglobin: 7.3 — AB (ref 12.0–16.0)
Neutrophils Absolute: 7
Platelets: 690 — AB (ref 150–399)
WBC: 10.6

## 2019-04-22 LAB — BASIC METABOLIC PANEL
BUN: 9 (ref 4–21)
CO2: 29 — AB (ref 13–22)
Chloride: 97 — AB (ref 99–108)
Creatinine: 0.3 — AB (ref 0.5–1.1)
Glucose: 83
Potassium: 4.2 (ref 3.4–5.3)
Sodium: 135 — AB (ref 137–147)

## 2019-04-22 LAB — CBC: RBC: 2.86 — AB (ref 3.87–5.11)

## 2019-04-22 LAB — POCT ERYTHROCYTE SEDIMENTATION RATE, NON-AUTOMATED: Sed Rate: 140

## 2019-04-29 DIAGNOSIS — D649 Anemia, unspecified: Secondary | ICD-10-CM | POA: Diagnosis not present

## 2019-04-29 DIAGNOSIS — L89890 Pressure ulcer of other site, unstageable: Secondary | ICD-10-CM | POA: Diagnosis not present

## 2019-04-29 DIAGNOSIS — E43 Unspecified severe protein-calorie malnutrition: Secondary | ICD-10-CM | POA: Diagnosis not present

## 2019-04-29 DIAGNOSIS — L89223 Pressure ulcer of left hip, stage 3: Secondary | ICD-10-CM | POA: Diagnosis not present

## 2019-04-29 DIAGNOSIS — L89154 Pressure ulcer of sacral region, stage 4: Secondary | ICD-10-CM | POA: Diagnosis not present

## 2019-04-29 DIAGNOSIS — L89510 Pressure ulcer of right ankle, unstageable: Secondary | ICD-10-CM | POA: Diagnosis not present

## 2019-04-29 LAB — HEPATIC FUNCTION PANEL
ALT: 5 — AB (ref 7–35)
AST: 15 (ref 13–35)
Alkaline Phosphatase: 58 (ref 25–125)
Bilirubin, Total: 0.2

## 2019-04-29 LAB — BASIC METABOLIC PANEL
BUN: 19 (ref 4–21)
CO2: 25 — AB (ref 13–22)
Chloride: 97 — AB (ref 99–108)
Creatinine: 0.3 — AB (ref 0.5–1.1)
Glucose: 91
Potassium: 4.6 (ref 3.4–5.3)
Sodium: 135 — AB (ref 137–147)

## 2019-04-29 LAB — CBC AND DIFFERENTIAL
HCT: 22 — AB (ref 36–46)
Hemoglobin: 7.2 — AB (ref 12.0–16.0)
Neutrophils Absolute: 8
Platelets: 607 — AB (ref 150–399)
WBC: 11

## 2019-04-29 LAB — COMPREHENSIVE METABOLIC PANEL
Albumin: 2.8 — AB (ref 3.5–5.0)
Calcium: 9 (ref 8.7–10.7)
GFR calc Af Amer: 90
GFR calc non Af Amer: 90
Globulin: 3.6

## 2019-04-29 LAB — CBC: RBC: 2.82 — AB (ref 3.87–5.11)

## 2019-04-30 ENCOUNTER — Other Ambulatory Visit (HOSPITAL_COMMUNITY): Payer: Self-pay | Admitting: Internal Medicine

## 2019-04-30 DIAGNOSIS — I10 Essential (primary) hypertension: Secondary | ICD-10-CM | POA: Diagnosis not present

## 2019-04-30 DIAGNOSIS — M869 Osteomyelitis, unspecified: Secondary | ICD-10-CM

## 2019-04-30 DIAGNOSIS — D649 Anemia, unspecified: Secondary | ICD-10-CM | POA: Diagnosis not present

## 2019-04-30 DIAGNOSIS — M2550 Pain in unspecified joint: Secondary | ICD-10-CM | POA: Diagnosis not present

## 2019-04-30 LAB — CBC AND DIFFERENTIAL
HCT: 23 — AB (ref 36–46)
Hemoglobin: 7.4 — AB (ref 12.0–16.0)
Neutrophils Absolute: 7
Platelets: 731 — AB (ref 150–399)
WBC: 10.4

## 2019-04-30 LAB — CBC: RBC: 2.98 — AB (ref 3.87–5.11)

## 2019-04-30 LAB — POCT ERYTHROCYTE SEDIMENTATION RATE, NON-AUTOMATED: Sed Rate: 145

## 2019-05-06 ENCOUNTER — Non-Acute Institutional Stay (SKILLED_NURSING_FACILITY): Payer: PPO | Admitting: Internal Medicine

## 2019-05-06 DIAGNOSIS — D649 Anemia, unspecified: Secondary | ICD-10-CM

## 2019-05-06 DIAGNOSIS — L89154 Pressure ulcer of sacral region, stage 4: Secondary | ICD-10-CM

## 2019-05-06 DIAGNOSIS — L89890 Pressure ulcer of other site, unstageable: Secondary | ICD-10-CM | POA: Diagnosis not present

## 2019-05-06 DIAGNOSIS — M2550 Pain in unspecified joint: Secondary | ICD-10-CM | POA: Diagnosis not present

## 2019-05-06 DIAGNOSIS — L89510 Pressure ulcer of right ankle, unstageable: Secondary | ICD-10-CM | POA: Diagnosis not present

## 2019-05-06 DIAGNOSIS — M4626 Osteomyelitis of vertebra, lumbar region: Secondary | ICD-10-CM

## 2019-05-06 DIAGNOSIS — L89223 Pressure ulcer of left hip, stage 3: Secondary | ICD-10-CM | POA: Diagnosis not present

## 2019-05-06 DIAGNOSIS — E43 Unspecified severe protein-calorie malnutrition: Secondary | ICD-10-CM | POA: Diagnosis not present

## 2019-05-06 LAB — HEPATIC FUNCTION PANEL
ALT: 5 — AB (ref 7–35)
AST: 12 — AB (ref 13–35)
Alkaline Phosphatase: 60 (ref 25–125)
Bilirubin, Total: 0.2

## 2019-05-06 LAB — COMPREHENSIVE METABOLIC PANEL
Albumin: 3 — AB (ref 3.5–5.0)
Calcium: 9 (ref 8.7–10.7)
GFR calc Af Amer: 90
GFR calc non Af Amer: 90
Globulin: 3.6

## 2019-05-06 LAB — BASIC METABOLIC PANEL
BUN: 14 (ref 4–21)
CO2: 25 — AB (ref 13–22)
Chloride: 96 — AB (ref 99–108)
Creatinine: 0.3 — AB (ref 0.5–1.1)
Glucose: 87
Potassium: 5 (ref 3.4–5.3)
Sodium: 134 — AB (ref 137–147)

## 2019-05-06 LAB — CBC: RBC: 2.76 — AB (ref 3.87–5.11)

## 2019-05-06 LAB — CBC AND DIFFERENTIAL
HCT: 21 — AB (ref 36–46)
Hemoglobin: 6.8 — AB (ref 12.0–16.0)
Neutrophils Absolute: 8
Platelets: 943 — AB (ref 150–399)
WBC: 11.4

## 2019-05-06 LAB — POCT ERYTHROCYTE SEDIMENTATION RATE, NON-AUTOMATED: Sed Rate: 150

## 2019-05-06 NOTE — Progress Notes (Signed)
Location:  [facility] Room Number: 306-W Place of Service:  SNF [phone]) Provider:  Granville Lewis, PA-C   Patient Care Team: [name], MD as PCP - General (Internal Medicine)  Extended Emergency Contact Information Primary Emergency Contact: Alycia Rossetti Mobile Phone: [phone] Relation: Aunt  Code Status:  DNR Goals of care: Advanced Directive information Advanced Directives 04/17/2019  Does Patient Have a Medical Advance Directive? Yes  Type of Advance Directive Out of facility DNR (pink MOST or yellow form)  Does patient want to make changes to medical advance directive? No - Patient declined  Pre-existing out of facility DNR order (yellow form or pink MOST form) Yellow form placed in chart (order not valid for inpatient use)     Chief Complaint  Patient presents with  . Acute Visit   Secondary to anemia with hemoglobin of 6.8  HPI:  Pt is a 74 y.o. female seen today for an acute visit for a hemoglobin of 6.8 on lab done today. She has a complicated medical history including a history of polymicrobial infection with osteomyelitis -- failure to thrive-- history of sacral ulcer-- General debility and anemia.  Patient has a history of chronic anemia-with hemoglobin running between the sevens and nines usually-she recently received a transfusion for hemoglobin of 6.9 and this did rise into the sevens but now is back down to 6.8.  She is on iron and B12 supplementation.  She also has a history of osteomyelitis and polymicrobial infection of the lumbar spine-had been on IV vancomycin for an extended course-she also has a concerning sacral wound-and an MRI is pending to rule out osteomyelitis.  The MRI is actually scheduled for tomorrow.  Currently she is resting in bed comfortably appears to be at her baseline frail but very responsive she is not really complaining of pain.      Past Medical History:  Diagnosis Date  . Alcohol abuse   .  Demyelinating disorder ([place])   . Tobacco abuse    No past surgical history on file.  No Known Allergies  Outpatient Encounter Medications as of 05/06/2019  Medication Sig  . acetaminophen (TYLENOL) 500 MG tablet Take 1,000 mg by mouth every 8 (eight) hours as needed for mild pain or headache.   . Amino Acids-Protein Hydrolys (FEEDING SUPPLEMENT, PRO-STAT SUGAR FREE 64,) LIQD Take 30 mLs by mouth 2 (two) times daily.  . bisacodyl (DULCOLAX) 10 MG suppository Place 10 mg rectally daily as needed for moderate constipation.  . calcium carbonate (TUMS - DOSED IN MG ELEMENTAL CALCIUM) 500 MG chewable tablet Chew 500 mg by mouth daily.   . Cholecalciferol (D3-1000) 25 MCG (1000 UT) capsule Take 1,000 Units by mouth daily.  . folic acid (FOLVITE) 1 MG tablet Take 1 mg by mouth daily.  . iron polysaccharides (NIFEREX) 150 MG capsule Take 150 mg by mouth 2 (two) times daily. 12 pm and 1800  . magnesium hydroxide (MILK OF MAGNESIA) 400 MG/5ML suspension Take 30 mLs by mouth daily as needed for mild constipation.  . mirtazapine (REMERON) 30 MG tablet Take 30 mg by mouth at bedtime.  . Multiple Vitamins-Minerals (MULTIVITAMIN WITH MINERALS) tablet Take 1 tablet by mouth daily.  . NON FORMULARY Diet order: Regular Diet.  . Nutritional Supplements (ENSURE ENLIVE PO) Take 237 mLs by mouth 3 (three) times daily.  . Nutritional Supplements (NUTRITIONAL SUPPLEMENT PO) Take 1 each by mouth 2 (two) times daily. Magic Cup with lunch and dinner  . polyethylene glycol (MIRALAX /  GLYCOLAX) 17 g packet Take 17 g by mouth 2 (two) times daily as needed for mild constipation.  . senna-docusate (SENOKOT-S) 8.6-50 MG tablet Take 2 tablets by mouth 2 (two) times daily.  . Sodium Phosphates (RA SALINE ENEMA RE) Place 1 each rectally daily as needed.  . thiamine 100 MG tablet Take 100 mg by mouth daily.  . vitamin B-12 (CYANOCOBALAMIN) 1000 MCG tablet Take 1,000 mcg by mouth daily.  . vitamin C (ASCORBIC ACID) 500 MG tablet  Take 500 mg by mouth 3 (three) times daily. With meals  . [DISCONTINUED] Sodium Chloride Flush (NORMAL SALINE FLUSH IV) FLUSH PICC LINE WITH 10ML NORMAL SALINE BEFORE ADMINISTRATION OF INTRAVENEOUSLY ABT IV  . [DISCONTINUED] Vancomycin HCl in NaCl 750-0.9 MG/150ML-% SOLN Inject 750 mg into the vein every 12 (twelve) hours.    No facility-administered encounter medications on file as of 05/06/2019.    Review of Systems General she is not complaining of any fever or chills.  Skin does not complain of rashes or itching does have a history of a sacral ulcer.  Head ears eyes nose mouth and throat is not complaining of visual changes or sore throat.  Respiratory does not complain of being short of breath or having a cough.  Cardiac does not complain of chest pain or increased edema.  GI does not really complain of abdominal discomfort nausea vomiting diarrhea constipation-continues to have a very spotty appetite  GU has an indwelling Foley catheter does not complain of dysuria.  Musculoskeletal does have significant weakness lower extremities with debility-she is not really complaining of pain however.  Neurologic positive for weakness does not complain of syncope headache or dizziness.  And psych does not really complain of being depressed or anxious  There is no immunization history on file for this patient. Pertinent  Health Maintenance Due  Topic Date Due  . MAMMOGRAM  07/29/1996  . COLONOSCOPY  07/29/1996  . DEXA SCAN  07/30/2011  . INFLUENZA VACCINE  06/26/2019 (Originally 10/27/2018)  . PNA vac Low Risk Adult (1 of 2 - PCV13) 03/10/2020 (Originally 07/30/2011)   Fall Risk  10/26/2018  Falls in the past year? (No Data)  Comment Emmi Telephone Survey: data to providers prior to load  Number falls in past yr: (No Data)  Comment Emmi Telephone Survey Actual Response =    Functional Status Survey:    Vitals:   05/06/19 1603  BP: 92/65  Pulse: (!) 106  Resp: 18  Temp: 98.6 F  (37 C)  TempSrc: Oral  Weight: 103 lb 9.6 oz (47 kg)  Height: 5\' 8"  (1.727 m)   Body mass index is 15.75 kg/m. Physical Exam In general this is a pleasant very frail-appearing elderly female in no distress she is bright and alert.  Her skin is warm and dry sacral ulcer is currently covered and not assessed because of patient positioning.  Eyes visual acuity appears to be intact sclera and conjunctive are clear.  Oropharynx clear mucous membranes moist.  Chest is clear to auscultation there is no labored breathing air entry is somewhat shallow.  Heart is regular rate and rhythm borderline tachycardic between 98- 106 pulse rate.  She does not have significant lower extremity edema.  Abdomen is soft nontender with positive bowel sounds.  GU has an indwelling Foley catheter with amber-colored urine.  Musculoskeletal continues with extensive lower extremity weakness with fairly minimal movement continues to hold her legs in a contracted position-- moves her upper extremities at baseline.  Neurologic  again is positive for extensive lower extremity weakness-- her speech is clear-- cranial nerves appear to be intact.  Psych she is grossly alert and oriented pleasant and appropriate  Labs reviewed: May 06, 2019  WBC 11.4 hemoglobin 6.8 platelets 943.  Sodium 134 potassium 5 BUN 13.8 creatinine 0.3.  Albumin 3.0 otherwise liver function tests within normal limits Recent Labs    04/01/19 0000 04/02/19 1205 04/08/19 0000  NA 136* 136 138  K 4.3 4.5 3.9  CL 100 100 103  CO2 26* 25 25*  GLUCOSE  --  110*  --   BUN 8 9 7   CREATININE 0.3* 0.36* 0.3*  CALCIUM 9.1 9.0 8.5*   Recent Labs    04/01/19 0000 04/02/19 1205 04/08/19 0000  AST 10* 23 13  ALT 5* 8 5*  ALKPHOS 55 53 48  BILITOT  --  0.5  --   PROT  --  7.2  --   ALBUMIN 2.7* 2.5* 2.4*   Recent Labs    04/02/19 1205 04/08/19 0000 04/11/19 0000  WBC 10.7* 8.9 10.1  NEUTROABS 7.5 6 7   HGB 7.1* 7.9* 7.7*   HCT 23.7* 24* 24*  MCV 83.2  --   --   PLT 772* 558* 627*   No results found for: TSH No results found for: HGBA1C No results found for: CHOL, HDL, LDLCALC, LDLDIRECT, TRIG, CHOLHDL  Significant Diagnostic Results in last 30 days:  No results found.  Assessment/Plan #1 anemia-her hemoglobin has dipped below 7 again-she is on iron as well as B12 supplementation.  We will send her to the ER for suspected transfusion we will do this tomorrow morning.  Speaking with staff she is scheduled for an MRI at [facility] tomorrow as well and hopefully this can be accomplished at the same time.  Currently she does not really complain of any acute distress or increased weakness from baseline.  She does have an intermittent elevated temperature but this has been somewhat long-term-  Again will send to the ER tomorrow and hopefully an MRI can be done as well which hopefully can provide some insight into the extent of her sacral wound.  [id number].        Granville Lewis, PA-C [phone]

## 2019-05-07 ENCOUNTER — Other Ambulatory Visit: Payer: Self-pay

## 2019-05-07 ENCOUNTER — Ambulatory Visit (HOSPITAL_COMMUNITY)
Admission: RE | Admit: 2019-05-07 | Discharge: 2019-05-07 | Disposition: A | Discharge status: Home / Self Care | Payer: PPO | Source: Ambulatory Visit | Attending: Internal Medicine | Admitting: Internal Medicine

## 2019-05-07 ENCOUNTER — Emergency Department (HOSPITAL_COMMUNITY): Payer: PPO

## 2019-05-07 ENCOUNTER — Inpatient Hospital Stay (HOSPITAL_COMMUNITY)
Admission: EM | Admit: 2019-05-07 | Discharge: 2019-05-10 | DRG: 539 | Disposition: A | Discharge status: Skilled Nursing Facility | Payer: PPO | Source: Ambulatory Visit | Attending: Internal Medicine | Admitting: Internal Medicine

## 2019-05-07 DIAGNOSIS — Z9889 Other specified postprocedural states: Secondary | ICD-10-CM | POA: Diagnosis not present

## 2019-05-07 DIAGNOSIS — M869 Osteomyelitis, unspecified: Secondary | ICD-10-CM | POA: Insufficient documentation

## 2019-05-07 DIAGNOSIS — F329 Major depressive disorder, single episode, unspecified: Secondary | ICD-10-CM | POA: Diagnosis not present

## 2019-05-07 DIAGNOSIS — R627 Adult failure to thrive: Secondary | ICD-10-CM | POA: Diagnosis not present

## 2019-05-07 DIAGNOSIS — R531 Weakness: Secondary | ICD-10-CM | POA: Diagnosis not present

## 2019-05-07 DIAGNOSIS — Z7189 Other specified counseling: Secondary | ICD-10-CM | POA: Diagnosis not present

## 2019-05-07 DIAGNOSIS — M4802 Spinal stenosis, cervical region: Secondary | ICD-10-CM | POA: Diagnosis present

## 2019-05-07 DIAGNOSIS — L89220 Pressure ulcer of left hip, unstageable: Secondary | ICD-10-CM | POA: Diagnosis present

## 2019-05-07 DIAGNOSIS — Z681 Body mass index (BMI) 19 or less, adult: Secondary | ICD-10-CM | POA: Diagnosis not present

## 2019-05-07 DIAGNOSIS — M609 Myositis, unspecified: Secondary | ICD-10-CM | POA: Diagnosis present

## 2019-05-07 DIAGNOSIS — L89326 Pressure-induced deep tissue damage of left buttock: Secondary | ICD-10-CM | POA: Diagnosis not present

## 2019-05-07 DIAGNOSIS — Z66 Do not resuscitate: Secondary | ICD-10-CM | POA: Diagnosis not present

## 2019-05-07 DIAGNOSIS — L89610 Pressure ulcer of right heel, unstageable: Secondary | ICD-10-CM | POA: Diagnosis not present

## 2019-05-07 DIAGNOSIS — G379 Demyelinating disease of central nervous system, unspecified: Secondary | ICD-10-CM | POA: Diagnosis not present

## 2019-05-07 DIAGNOSIS — R64 Cachexia: Secondary | ICD-10-CM | POA: Diagnosis present

## 2019-05-07 DIAGNOSIS — R079 Chest pain, unspecified: Secondary | ICD-10-CM | POA: Diagnosis not present

## 2019-05-07 DIAGNOSIS — R5381 Other malaise: Secondary | ICD-10-CM | POA: Diagnosis not present

## 2019-05-07 DIAGNOSIS — L89514 Pressure ulcer of right ankle, stage 4: Secondary | ICD-10-CM | POA: Diagnosis not present

## 2019-05-07 DIAGNOSIS — L89212 Pressure ulcer of right hip, stage 2: Secondary | ICD-10-CM | POA: Diagnosis present

## 2019-05-07 DIAGNOSIS — E43 Unspecified severe protein-calorie malnutrition: Secondary | ICD-10-CM | POA: Diagnosis not present

## 2019-05-07 DIAGNOSIS — L89622 Pressure ulcer of left heel, stage 2: Secondary | ICD-10-CM | POA: Diagnosis present

## 2019-05-07 DIAGNOSIS — L89159 Pressure ulcer of sacral region, unspecified stage: Secondary | ICD-10-CM | POA: Diagnosis not present

## 2019-05-07 DIAGNOSIS — M4628 Osteomyelitis of vertebra, sacral and sacrococcygeal region: Secondary | ICD-10-CM | POA: Diagnosis not present

## 2019-05-07 DIAGNOSIS — M24562 Contracture, left knee: Secondary | ICD-10-CM | POA: Diagnosis present

## 2019-05-07 DIAGNOSIS — M48062 Spinal stenosis, lumbar region with neurogenic claudication: Secondary | ICD-10-CM | POA: Diagnosis not present

## 2019-05-07 DIAGNOSIS — D509 Iron deficiency anemia, unspecified: Secondary | ICD-10-CM | POA: Diagnosis present

## 2019-05-07 DIAGNOSIS — L89890 Pressure ulcer of other site, unstageable: Secondary | ICD-10-CM | POA: Diagnosis present

## 2019-05-07 DIAGNOSIS — L89892 Pressure ulcer of other site, stage 2: Secondary | ICD-10-CM | POA: Diagnosis not present

## 2019-05-07 DIAGNOSIS — M48061 Spinal stenosis, lumbar region without neurogenic claudication: Secondary | ICD-10-CM | POA: Diagnosis present

## 2019-05-07 DIAGNOSIS — J069 Acute upper respiratory infection, unspecified: Secondary | ICD-10-CM | POA: Diagnosis not present

## 2019-05-07 DIAGNOSIS — I959 Hypotension, unspecified: Secondary | ICD-10-CM | POA: Diagnosis not present

## 2019-05-07 DIAGNOSIS — D473 Essential (hemorrhagic) thrombocythemia: Secondary | ICD-10-CM | POA: Diagnosis present

## 2019-05-07 DIAGNOSIS — Z515 Encounter for palliative care: Secondary | ICD-10-CM | POA: Diagnosis not present

## 2019-05-07 DIAGNOSIS — D638 Anemia in other chronic diseases classified elsewhere: Secondary | ICD-10-CM | POA: Diagnosis not present

## 2019-05-07 DIAGNOSIS — Z981 Arthrodesis status: Secondary | ICD-10-CM | POA: Diagnosis not present

## 2019-05-07 DIAGNOSIS — Z881 Allergy status to other antibiotic agents status: Secondary | ICD-10-CM | POA: Diagnosis not present

## 2019-05-07 DIAGNOSIS — Z7401 Bed confinement status: Secondary | ICD-10-CM | POA: Diagnosis not present

## 2019-05-07 DIAGNOSIS — M8630 Chronic multifocal osteomyelitis, unspecified site: Secondary | ICD-10-CM

## 2019-05-07 DIAGNOSIS — F17210 Nicotine dependence, cigarettes, uncomplicated: Secondary | ICD-10-CM | POA: Diagnosis not present

## 2019-05-07 DIAGNOSIS — M47816 Spondylosis without myelopathy or radiculopathy, lumbar region: Secondary | ICD-10-CM | POA: Diagnosis not present

## 2019-05-07 DIAGNOSIS — R8281 Pyuria: Secondary | ICD-10-CM | POA: Diagnosis not present

## 2019-05-07 DIAGNOSIS — M24561 Contracture, right knee: Secondary | ICD-10-CM | POA: Diagnosis present

## 2019-05-07 DIAGNOSIS — F1010 Alcohol abuse, uncomplicated: Secondary | ICD-10-CM | POA: Diagnosis not present

## 2019-05-07 DIAGNOSIS — D649 Anemia, unspecified: Secondary | ICD-10-CM | POA: Diagnosis not present

## 2019-05-07 DIAGNOSIS — Z20822 Contact with and (suspected) exposure to covid-19: Secondary | ICD-10-CM | POA: Diagnosis present

## 2019-05-07 DIAGNOSIS — L89154 Pressure ulcer of sacral region, stage 4: Secondary | ICD-10-CM | POA: Diagnosis present

## 2019-05-07 DIAGNOSIS — Z03818 Encounter for observation for suspected exposure to other biological agents ruled out: Secondary | ICD-10-CM | POA: Diagnosis not present

## 2019-05-07 LAB — CBC WITH DIFFERENTIAL/PLATELET
Abs Immature Granulocytes: 0.04 10*3/uL (ref 0.00–0.07)
Basophils Absolute: 0.1 10*3/uL (ref 0.0–0.1)
Basophils Relative: 1 %
Eosinophils Absolute: 0.2 10*3/uL (ref 0.0–0.5)
Eosinophils Relative: 2 %
HCT: 24.7 % — ABNORMAL LOW (ref 36.0–46.0)
Hemoglobin: 7.4 g/dL — ABNORMAL LOW (ref 12.0–15.0)
Immature Granulocytes: 0 %
Lymphocytes Relative: 21 %
Lymphs Abs: 2.2 10*3/uL (ref 0.7–4.0)
MCH: 24.3 pg — ABNORMAL LOW (ref 26.0–34.0)
MCHC: 30 g/dL (ref 30.0–36.0)
MCV: 81.3 fL (ref 80.0–100.0)
Monocytes Absolute: 0.8 10*3/uL (ref 0.1–1.0)
Monocytes Relative: 7 %
Neutro Abs: 7.1 10*3/uL (ref 1.7–7.7)
Neutrophils Relative %: 69 %
Platelets: 1028 10*3/uL (ref 150–400)
RBC: 3.04 MIL/uL — ABNORMAL LOW (ref 3.87–5.11)
RDW: 18.6 % — ABNORMAL HIGH (ref 11.5–15.5)
WBC: 10.4 10*3/uL (ref 4.0–10.5)
nRBC: 0 % (ref 0.0–0.2)

## 2019-05-07 LAB — COMPREHENSIVE METABOLIC PANEL
ALT: 10 U/L (ref 0–44)
AST: 15 U/L (ref 15–41)
Albumin: 2.4 g/dL — ABNORMAL LOW (ref 3.5–5.0)
Alkaline Phosphatase: 52 U/L (ref 38–126)
Anion gap: 12 (ref 5–15)
BUN: 22 mg/dL (ref 8–23)
CO2: 26 mmol/L (ref 22–32)
Calcium: 9.5 mg/dL (ref 8.9–10.3)
Chloride: 97 mmol/L — ABNORMAL LOW (ref 98–111)
Creatinine, Ser: 0.42 mg/dL — ABNORMAL LOW (ref 0.44–1.00)
GFR calc Af Amer: >60 mL/min (ref >60)
GFR calc non Af Amer: >60 mL/min (ref >60)
Glucose, Bld: 102 mg/dL — ABNORMAL HIGH (ref 70–99)
Potassium: 4.3 mmol/L (ref 3.5–5.1)
Sodium: 135 mmol/L (ref 135–145)
Total Bilirubin: 0.4 mg/dL (ref 0.3–1.2)
Total Protein: 7.8 g/dL (ref 6.5–8.1)

## 2019-05-07 LAB — URINALYSIS, ROUTINE W REFLEX MICROSCOPIC
Bilirubin Urine: NEGATIVE
Glucose, UA: NEGATIVE mg/dL
Hgb urine dipstick: NEGATIVE
Ketones, ur: 5 mg/dL — AB
Nitrite: NEGATIVE
Protein, ur: NEGATIVE mg/dL
Specific Gravity, Urine: 1.027 (ref 1.005–1.030)
WBC, UA: >50 WBC/hpf — ABNORMAL HIGH (ref 0–5)
pH: 5 (ref 5.0–8.0)

## 2019-05-07 LAB — POC OCCULT BLOOD, ED: Fecal Occult Bld: NEGATIVE

## 2019-05-07 LAB — LACTIC ACID, PLASMA
Lactic Acid, Venous: 1.2 mmol/L (ref 0.5–1.9)
Lactic Acid, Venous: 1.2 mmol/L (ref 0.5–1.9)

## 2019-05-07 LAB — POC SARS CORONAVIRUS 2 AG -  ED: SARS Coronavirus 2 Ag: NEGATIVE

## 2019-05-07 LAB — ABO/RH: ABO/RH(D): O POS

## 2019-05-07 MED ORDER — FOLIC ACID 1 MG PO TABS
1.0000 mg | ORAL_TABLET | Freq: Every day | ORAL | Status: DC
  Administered 2019-05-07 – 2019-05-10 (×4): 1 mg via ORAL
  Filled 2019-05-07 (×4): qty 1

## 2019-05-07 MED ORDER — ADULT MULTIVITAMIN W/MINERALS CH
1.0000 | ORAL_TABLET | Freq: Every day | ORAL | Status: DC
  Administered 2019-05-07 – 2019-05-10 (×4): 1 via ORAL
  Filled 2019-05-07 (×4): qty 1

## 2019-05-07 MED ORDER — VANCOMYCIN HCL IN DEXTROSE 1-5 GM/200ML-% IV SOLN: 1000.0000 mg | Freq: Once | INTRAVENOUS | Status: DC

## 2019-05-07 MED ORDER — VANCOMYCIN HCL IN DEXTROSE 1-5 GM/200ML-% IV SOLN
1000.0000 mg | INTRAVENOUS | Status: DC
  Administered 2019-05-07 – 2019-05-09 (×3): 1000 mg via INTRAVENOUS
  Filled 2019-05-07 (×4): qty 200

## 2019-05-07 MED ORDER — PIPERACILLIN-TAZOBACTAM 3.375 G IVPB 30 MIN
3.3750 g | Freq: Once | INTRAVENOUS | Status: AC
  Administered 2019-05-07: 3.375 g via INTRAVENOUS
  Filled 2019-05-07 (×2): qty 50

## 2019-05-07 MED ORDER — ONDANSETRON HCL 4 MG/2ML IJ SOLN: 4.0000 mg | Freq: Four times a day (QID) | INTRAMUSCULAR | Status: DC | PRN

## 2019-05-07 MED ORDER — POLYSACCHARIDE IRON COMPLEX 150 MG PO CAPS
150.0000 mg | ORAL_CAPSULE | Freq: Two times a day (BID) | ORAL | Status: DC
  Administered 2019-05-07 – 2019-05-10 (×6): 150 mg via ORAL
  Filled 2019-05-07 (×6): qty 1

## 2019-05-07 MED ORDER — GADOBUTROL 1 MMOL/ML IV SOLN
5.0000 mL | Freq: Once | INTRAVENOUS | Status: AC | PRN
  Administered 2019-05-07: 5 mL via INTRAVENOUS

## 2019-05-07 MED ORDER — ASCORBIC ACID 500 MG PO TABS
500.0000 mg | ORAL_TABLET | Freq: Three times a day (TID) | ORAL | Status: DC
  Administered 2019-05-07 – 2019-05-10 (×9): 500 mg via ORAL
  Filled 2019-05-07 (×9): qty 1

## 2019-05-07 MED ORDER — SODIUM CHLORIDE 0.9 % IV SOLN
2.0000 g | Freq: Once | INTRAVENOUS | Status: AC
  Administered 2019-05-07: 2 g via INTRAVENOUS
  Filled 2019-05-07: qty 20

## 2019-05-07 MED ORDER — MAGNESIUM HYDROXIDE 400 MG/5ML PO SUSP: 30.0000 mL | Freq: Every day | ORAL | Status: DC | PRN

## 2019-05-07 MED ORDER — PIPERACILLIN-TAZOBACTAM 3.375 G IVPB
3.3750 g | Freq: Three times a day (TID) | INTRAVENOUS | Status: DC
  Administered 2019-05-08 – 2019-05-10 (×8): 3.375 g via INTRAVENOUS
  Filled 2019-05-07 (×8): qty 50

## 2019-05-07 MED ORDER — POLYETHYLENE GLYCOL 3350 17 G PO PACK: 17.0000 g | PACK | Freq: Two times a day (BID) | ORAL | Status: DC | PRN

## 2019-05-07 MED ORDER — CALCIUM CARBONATE ANTACID 500 MG PO CHEW
500.0000 mg | CHEWABLE_TABLET | Freq: Every day | ORAL | Status: DC
  Administered 2019-05-07 – 2019-05-10 (×4): 500 mg via ORAL
  Filled 2019-05-07 (×5): qty 3

## 2019-05-07 MED ORDER — VITAMIN B-12 1000 MCG PO TABS
1000.0000 ug | ORAL_TABLET | Freq: Every day | ORAL | Status: DC
  Administered 2019-05-07 – 2019-05-10 (×4): 1000 ug via ORAL
  Filled 2019-05-07 (×4): qty 1

## 2019-05-07 MED ORDER — ONDANSETRON HCL 4 MG PO TABS: 4.0000 mg | ORAL_TABLET | Freq: Four times a day (QID) | ORAL | Status: DC | PRN

## 2019-05-07 MED ORDER — SODIUM CHLORIDE 0.9 % IV BOLUS
500.0000 mL | Freq: Once | INTRAVENOUS | Status: AC
  Administered 2019-05-07: 500 mL via INTRAVENOUS

## 2019-05-07 MED ORDER — MIRTAZAPINE 30 MG PO TABS
30.0000 mg | ORAL_TABLET | Freq: Every day | ORAL | Status: DC
  Administered 2019-05-07 – 2019-05-09 (×3): 30 mg via ORAL
  Filled 2019-05-07: qty 2
  Filled 2019-05-07 (×4): qty 1
  Filled 2019-05-07: qty 2

## 2019-05-07 MED ORDER — VITAMIN D 25 MCG (1000 UNIT) PO TABS
1000.0000 [IU] | ORAL_TABLET | Freq: Every day | ORAL | Status: DC
  Administered 2019-05-07 – 2019-05-10 (×4): 1000 [IU] via ORAL
  Filled 2019-05-07 (×5): qty 1

## 2019-05-07 MED ORDER — THIAMINE HCL 100 MG PO TABS
100.0000 mg | ORAL_TABLET | Freq: Every day | ORAL | Status: DC
  Administered 2019-05-07 – 2019-05-10 (×4): 100 mg via ORAL
  Filled 2019-05-07 (×5): qty 1

## 2019-05-07 MED ORDER — SENNOSIDES-DOCUSATE SODIUM 8.6-50 MG PO TABS
2.0000 | ORAL_TABLET | Freq: Two times a day (BID) | ORAL | Status: DC
  Administered 2019-05-07 – 2019-05-10 (×6): 2 via ORAL
  Filled 2019-05-07 (×7): qty 2

## 2019-05-07 MED ORDER — ACETAMINOPHEN 500 MG PO TABS: 1000.0000 mg | ORAL_TABLET | Freq: Three times a day (TID) | ORAL | Status: DC | PRN

## 2019-05-07 MED ORDER — BISACODYL 10 MG RE SUPP: 10.0000 mg | Freq: Every day | RECTAL | Status: DC | PRN

## 2019-05-07 MED ORDER — ENOXAPARIN SODIUM 40 MG/0.4ML ~~LOC~~ SOLN
40.0000 mg | SUBCUTANEOUS | Status: DC
  Administered 2019-05-07 – 2019-05-09 (×3): 40 mg via SUBCUTANEOUS
  Filled 2019-05-07 (×3): qty 0.4

## 2019-05-07 MED ORDER — PRO-STAT SUGAR FREE PO LIQD
30.0000 mL | Freq: Two times a day (BID) | ORAL | Status: DC
  Administered 2019-05-07 – 2019-05-08 (×2): 30 mL via ORAL
  Filled 2019-05-07 (×2): qty 30

## 2019-05-07 NOTE — ED Provider Notes (Signed)
4:53 PM   Patient is awake, alert, sitting upright, speaking clearly.  Care of the patient assumed at signout. Remaining results consistent with known infection, no new critical lab abnormalities.  Patient has received vancomycin, ceftriaxone, with MRI concerning for osteomyelitis, in the context of a known and nonhealing decubitus ulcer. I discussed her case with our hospitalist colleagues for admission, and per request, our general surgery colleagues to follow as a consulting team as necessary.   Carmin Muskrat, MD 05/07/19 [phone]

## 2019-05-07 NOTE — Progress Notes (Signed)
Pharmacy Antibiotic Note  Mary Frazier is a 74 y.o. female admitted on 05/07/2019 with Cellulitis/ possible osteo .  Pharmacy has been consulted for Vancomycin dosing.  Height: 5\' 7"  (170.2 cm) IBW/kg (Calculated) : 61.6  Temp (24hrs), Avg:99.4 F (37.4 C), Min:98.6 F (37 C), Max:100.3 F (37.9 C)  No results for input(s): WBC, CREATININE, LATICACIDVEN, VANCOTROUGH, VANCOPEAK, VANCORANDOM, GENTTROUGH, GENTPEAK, GENTRANDOM, TOBRATROUGH, TOBRAPEAK, TOBRARND, AMIKACINPEAK, AMIKACINTROU, AMIKACIN in the last 168 hours.  CrCl cannot be calculated (Patient's most recent lab result is older than the maximum 21 days allowed.).    No Known Allergies  Antimicrobials this admission: 2/9 Ctx >>  2/9 Vancomycin >>   Dose adjustments this admission:   Microbiology results: Pending   Plan:  - Vancomycin 1000mg  IV q24h  - Est calc AUC 530 - Monitor patients renal function and urine output  Thank you for allowing pharmacy to be a part of this patient's care.  Duanne Limerick PharmD. BCPS 05/07/2019 3:08 PM

## 2019-05-07 NOTE — ED Provider Notes (Signed)
[facility] EMERGENCY DEPARTMENT Provider Note   CSN: [id number] Arrival date & time: 05/07/19  1332     History Chief Complaint  Patient presents with   Wound Check    Mary Frazier is a 74 y.o. female.  HPI 74 year old female presents from MRI.  Unfortunately the history is very limited as EMS did not know why she was sent and no paperwork was sent with her indicating why.  The patient tells me she is here for a blood transfusion. History is limited based on patient being disoriented. Thus level 5 caveat.    Past Medical History:  Diagnosis Date   Alcohol abuse    Demyelinating disorder ([place])    Tobacco abuse     Patient Active Problem List   Diagnosis Date Noted   Osteomyelitis of lumbar spine ([place]) 12/24/2018   Sacral decubitus ulcer, stage IV ([place]) 12/24/2018   Severe protein-calorie malnutrition [place]: less than 60% of standard weight) ([place]) 12/24/2018   Normocytic anemia 12/24/2018   Vitamin D deficiency 12/24/2018   Polymicrobial bacterial infection 12/21/2018   Demyelinating disease of central nervous system ([place]) 12/02/2018   Pressure ulcer, stage II ([place]) 12/02/2018   Lumbar stenosis 11/24/2018   Cervical stenosis of spine 11/24/2018   Urinary retention 11/24/2018   Tobacco abuse 11/24/2018   Alcohol abuse 11/24/2018   Hypokalemia 11/24/2018   Hypomagnesemia 11/24/2018   Hyponatremia 11/24/2018    No past surgical history on file.   OB History   No obstetric history on file.     No family history on file.  Social History   Tobacco Use   Smoking status: Current Every Day Smoker    Packs/day: 0.50    Types: Cigarettes   Smokeless tobacco: Never Used  Substance Use Topics   Alcohol use: Never   Drug use: Never    Home Medications Prior to Admission medications   Medication Sig Start Date End Date Taking? Authorizing Provider  acetaminophen (TYLENOL) 500 MG tablet Take 1,000 mg by mouth every 8  (eight) hours as needed for mild pain or headache.     [provider]  Amino Acids-Protein Hydrolys (FEEDING SUPPLEMENT, PRO-STAT SUGAR FREE 64,) LIQD Take 30 mLs by mouth 2 (two) times daily.    [provider]  bisacodyl (DULCOLAX) 10 MG suppository Place 10 mg rectally daily as needed for moderate constipation.    [provider]  calcium carbonate (TUMS - DOSED IN MG ELEMENTAL CALCIUM) 500 MG chewable tablet Chew 500 mg by mouth daily.     [provider]  Cholecalciferol (D3-1000) 25 MCG (1000 UT) capsule Take 1,000 Units by mouth daily.    [provider]  folic acid (FOLVITE) 1 MG tablet Take 1 mg by mouth daily.    [provider]  iron polysaccharides (NIFEREX) 150 MG capsule Take 150 mg by mouth 2 (two) times daily. 12 pm and 1800    [provider]  magnesium hydroxide (MILK OF MAGNESIA) 400 MG/5ML suspension Take 30 mLs by mouth daily as needed for mild constipation.    [provider]  mirtazapine (REMERON) 30 MG tablet Take 30 mg by mouth at bedtime.    [provider]  Multiple Vitamins-Minerals (MULTIVITAMIN WITH MINERALS) tablet Take 1 tablet by mouth daily.    [provider]  NON FORMULARY Diet order: Regular Diet.    [provider]  Nutritional Supplements (ENSURE ENLIVE PO) Take 237 mLs by mouth 3 (three) times daily.  [provider]  Nutritional Supplements (NUTRITIONAL SUPPLEMENT PO) Take 1 each by mouth 2 (two) times daily. Magic Cup with lunch and dinner    [provider]  polyethylene glycol (MIRALAX / GLYCOLAX) 17 g packet Take 17 g by mouth 2 (two) times daily as needed for mild constipation.    [provider]  senna-docusate (SENOKOT-S) 8.6-50 MG tablet Take 2 tablets by mouth 2 (two) times daily.    [provider]  Sodium Phosphates (RA SALINE ENEMA RE) Place 1 each rectally daily as needed.    [provider]  thiamine  100 MG tablet Take 100 mg by mouth daily.    [provider]  vitamin B-12 (CYANOCOBALAMIN) 1000 MCG tablet Take 1,000 mcg by mouth daily.    [provider]  vitamin C (ASCORBIC ACID) 500 MG tablet Take 500 mg by mouth 3 (three) times daily. With meals    [provider]    Allergies    Patient has no known allergies.  Review of Systems   Review of Systems  Unable to perform ROS: Other    Physical Exam Updated Vital Signs BP (!) 93/58 (BP Location: Right Arm)    Pulse (!) 115    Temp 100.3 F (37.9 C) (Rectal)    Resp (!) 29    SpO2 100%   Physical Exam Vitals and nursing note reviewed.  Constitutional:      General: She is not in acute distress.    Appearance: She is well-developed. She is not ill-appearing or diaphoretic.  HENT:     Head: Normocephalic and atraumatic.     Right Ear: External ear normal.     Left Ear: External ear normal.     Nose: Nose normal.  Eyes:     General:        Right eye: No discharge.        Left eye: No discharge.  Cardiovascular:     Rate and Rhythm: Regular rhythm. Tachycardia present.     Heart sounds: Normal heart sounds.  Pulmonary:     Effort: Pulmonary effort is normal.     Breath sounds: Normal breath sounds.  Abdominal:     General: There is no distension.     Palpations: Abdomen is soft.     Tenderness: There is no abdominal tenderness.  Musculoskeletal:     Comments: Contractures of legs with soft boots on Multiple decubitus wounds, largest of which has some drainage  Skin:    General: Skin is warm and dry.  Neurological:     Mental Status: She is alert. She is disoriented.  Psychiatric:        Mood and Affect: Mood is not anxious.     ED Results / Procedures / Treatments   Labs (all labs ordered are listed, but only abnormal results are displayed) Labs Reviewed  CULTURE, BLOOD (ROUTINE X 2)  CULTURE, BLOOD (ROUTINE X 2)  URINE CULTURE  COMPREHENSIVE METABOLIC PANEL  URINALYSIS, ROUTINE  W REFLEX MICROSCOPIC  LACTIC ACID, PLASMA  LACTIC ACID, PLASMA  CBC WITH DIFFERENTIAL/PLATELET  POC OCCULT BLOOD, ED  POC SARS CORONAVIRUS 2 AG -  ED  TYPE AND SCREEN    EKG None  Radiology MR SACRUM SI JOINTS W WO CONTRAST  Result Date: 05/07/2019 CLINICAL DATA:  Nonhealing wound over the buttock. EXAM: MRI LUMBAR SPINE WITHOUT AND WITH CONTRAST TECHNIQUE: Multiplanar and multiecho pulse sequences of the lumbar spine were obtained without and with intravenous contrast. CONTRAST:  71mL GADAVIST GADOBUTROL 1 MMOL/ML IV SOLN COMPARISON:  None. FINDINGS: Bones/Joint/Cartilage Large sacral decubitus ulcer extending to the cortex with mild sacrococcygeal edema concerning for mild osteomyelitis. No fracture or dislocation. Normal alignment. No joint effusion. Mild osteoarthritis of bilateral SI joints. Inter discal fluid at L3-4 and L4-5 which may be secondary to degenerative changes versus less likely discitis. Degenerative disease with disc height loss throughout the lumbar spine. Broad-based disc osteophyte complex at L3-4, L4-5 and L5-S1 with bilateral facet arthropathy. Severe bilateral foraminal stenosis at L4-5 and L5-S1. Ligaments, Muscles and Tendons Generalized muscle atrophy. T2 hyperintense signal within the right gluteus maximus muscle as can be seen with myositis versus neurogenic edema. Mild muscle edema and perifascial edema in the adductor muscles bilaterally. Soft tissue Generalized soft tissue edema in the subcutaneous fat. More focal soft tissue edema and inflammatory changes overlying the posterior aspect of the left ilium likely reflecting more focal cellulitis. No drainable fluid collection or hematoma. 4 x 2 cm T1 and T2 hyperintense soft tissue mass inferior to the right posterior hip joint likely reflecting a simple lipoma. IMPRESSION: 1. Large sacral decubitus ulcer extending to the cortex with mild sacrococcygeal edema concerning for mild osteomyelitis. 2. T2 hyperintense signal  within the right gluteus maximus muscle. Mild muscle edema and perifascial edema in the adductor muscles bilaterally. This may related to mild anasarca versus mild myositis. 3. Inter discal fluid at L3-4 and L4-5 which may be secondary to degenerative changes versus less likely discitis. 4. Diffuse lumbar spine spondylosis. Electronically Signed   By: Kathreen Devoid   On: 05/07/2019 14:41    Procedures Procedures (including critical care time)  Medications Ordered in ED Medications  sodium chloride 0.9 % bolus 500 mL (has no administration in time range)  vancomycin (VANCOCIN) IVPB 1000 mg/200 mL premix (has no administration in time range)  cefTRIAXone (ROCEPHIN) 2 g in sodium chloride 0.9 % 100 mL IVPB (has no administration in time range)    ED Course  I have reviewed the triage vital signs and the nursing notes.  Pertinent labs & imaging results that were available during my care of the patient were reviewed by me and considered in my medical decision making (see chart for details).    MDM Rules/Calculators/A&P                      History is very limited.  Given the low-grade fever of 100.3, tachycardia, soft blood pressures, I will treat as presumed sepsis I shows some osteomyelitis.  I think she will need admission.  I did call the emergency contact listed but it was busy.  I tried to call the home phone of the patient and no one answered.  Care to Dr. Vanita Panda with labs pending. Final Clinical Impression(s) / ED Diagnoses Final diagnoses:  None    Rx / DC Orders ED Discharge Orders    None       Sherwood Gambler, MD 05/07/19 1542

## 2019-05-07 NOTE — ED Notes (Signed)
Report attempted, 6N unable to take at this time 

## 2019-05-07 NOTE — Consult Note (Signed)
Reason for Consult: Decubitus ulcer, stage IV Referring Physician: Dr. [name] is an 74 y.o. female.  HPI: Patient is a 74 year old female who comes in with a past medical history is significant for demyelinating disorder, immobility.  Patient states that she had an issue with swallowing recently.  Secondary to continued trouble with ambulation and fatigue patient was brought to the ER for further evaluation.  Upon evaluation in the ER patient was found to have a stage IV sacral decubitus ulcer.  I was able to review the results of the MRI.  Patient be admitted by the internal medicine team for antibiotics and further treatment.  General surgery was consulted for further evaluation of the decubitus ulcer.  Past Medical History:  Diagnosis Date  . Alcohol abuse   . Demyelinating disorder ([place])   . Tobacco abuse     No past surgical history on file.  No family history on file.  Social History:  reports that she has been smoking cigarettes. She has been smoking about 0.50 packs per day. She has never used smokeless tobacco. She reports that she does not drink alcohol or use drugs.  Allergies: No Known Allergies  Medications: I have reviewed the patient's current medications.  Results for orders placed or performed during the hospital encounter of 05/07/19 (from the past 48 hour(s))  Comprehensive metabolic panel     Status: Abnormal   Collection Time: 05/07/19  2:30 PM  Result Value Ref Range   Sodium 135 135 - 145 mmol/L   Potassium 4.3 3.5 - 5.1 mmol/L   Chloride 97 (L) 98 - 111 mmol/L   CO2 26 22 - 32 mmol/L   Glucose, Bld 102 (H) 70 - 99 mg/dL   BUN 22 8 - 23 mg/dL   Creatinine, Ser 0.42 (L) 0.44 - 1.00 mg/dL   Calcium 9.5 8.9 - 10.3 mg/dL   Total Protein 7.8 6.5 - 8.1 g/dL   Albumin 2.4 (L) 3.5 - 5.0 g/dL   AST 15 15 - 41 U/L   ALT 10 0 - 44 U/L   Alkaline Phosphatase 52 38 - 126 U/L   Total Bilirubin 0.4 0.3 - 1.2 mg/dL   GFR calc non Af Amer >60 >60  mL/min   GFR calc Af Amer >60 >60 mL/min   Anion gap 12 5 - 15    Comment: Performed at [facility], [address], [place], [place] [address]  Lactic acid, plasma     Status: None   Collection Time: 05/07/19  2:30 PM  Result Value Ref Range   Lactic Acid, Venous 1.2 0.5 - 1.9 mmol/L    Comment: Performed at [facility] [address], [place],  [address]  CBC with Differential     Status: Abnormal   Collection Time: 05/07/19  2:30 PM  Result Value Ref Range   WBC 10.4 4.0 - 10.5 K/uL   RBC 3.04 (L) 3.87 - 5.11 MIL/uL   Hemoglobin 7.4 (L) 12.0 - 15.0 g/dL   HCT 24.7 (L) 36.0 - 46.0 %   MCV 81.3 80.0 - 100.0 fL   MCH 24.3 (L) 26.0 - 34.0 pg   MCHC 30.0 30.0 - 36.0 g/dL   RDW 18.6 (H) 11.5 - 15.5 %   Platelets 1,028 (HH) 150 - 400 K/uL    Comment: REPEATED TO VERIFY PLATELET COUNT CONFIRMED BY SMEAR CLUMPS NOTED ON SMEAR THIS CRITICAL RESULT HAS VERIFIED AND BEEN CALLED TO G.KOTT RN BY [facility]  ON 02 09 03-Jun-2019 AT 1614, AND HAS BEEN READ BACK.     nRBC 0.0 0.0 - 0.2 %   Neutrophils Relative % 69 %   Neutro Abs 7.1 1.7 - 7.7 K/uL   Lymphocytes Relative 21 %   Lymphs Abs 2.2 0.7 - 4.0 K/uL   Monocytes Relative 7 %   Monocytes Absolute 0.8 0.1 - 1.0 K/uL   Eosinophils Relative 2 %   Eosinophils Absolute 0.2 0.0 - 0.5 K/uL   Basophils Relative 1 %   Basophils Absolute 0.1 0.0 - 0.1 K/uL   Immature Granulocytes 0 %   Abs Immature Granulocytes 0.04 0.00 - 0.07 K/uL    Comment: Performed at [facility] [address], [place], [place] [address]  Type and screen [place]     Status: None   Collection Time: 05/07/19  2:30 PM  Result Value Ref Range   ABO/RH(D) O POS    Antibody Screen NEG    Sample Expiration      05/10/2019,2359 Performed at [facility], [place] [address], [place], [place] [address]   ABO/Rh     Status: None   Collection Time: 05/07/19  2:30 PM  Result Value Ref Range   ABO/RH(D)      O  POS Performed at [facility] [address], [place], [place] [address]   POC occult blood, ED     Status: None   Collection Time: 05/07/19  2:58 PM  Result Value Ref Range   Fecal Occult Bld NEGATIVE NEGATIVE  Urinalysis, Routine w reflex microscopic     Status: Abnormal   Collection Time: 05/07/19  4:13 PM  Result Value Ref Range   Color, Urine AMBER (A) YELLOW    Comment: BIOCHEMICALS MAY BE AFFECTED BY COLOR   APPearance CLOUDY (A) CLEAR   Specific Gravity, Urine 1.027 1.005 - 1.030   pH 5.0 5.0 - 8.0   Glucose, UA NEGATIVE NEGATIVE mg/dL   Hgb urine dipstick NEGATIVE NEGATIVE   Bilirubin Urine NEGATIVE NEGATIVE   Ketones, ur 5 (A) NEGATIVE mg/dL   Protein, ur NEGATIVE NEGATIVE mg/dL   Nitrite NEGATIVE NEGATIVE   Leukocytes,Ua LARGE (A) NEGATIVE   RBC / HPF 0-5 0 - 5 RBC/hpf   WBC, UA >50 (H) 0 - 5 WBC/hpf   Bacteria, UA MANY (A) NONE SEEN   Squamous Epithelial / LPF 0-5 0 - 5   WBC Clumps PRESENT    Mucus PRESENT    Ca Oxalate Crys, UA PRESENT     Comment: Performed at [facility], [address], [place], [place] [address]  Lactic acid, plasma     Status: None   Collection Time: 05/07/19  4:13 PM  Result Value Ref Range   Lactic Acid, Venous 1.2 0.5 - 1.9 mmol/L    Comment: Performed at [facility] [address], [place], [place] [address]  POC SARS Coronavirus 2 Ag-ED - Nasal Swab (BD Veritor Kit)     Status: None   Collection Time: 05/07/19  4:26 PM  Result Value Ref Range   SARS Coronavirus 2 Ag NEGATIVE NEGATIVE    Comment: (NOTE) SARS-CoV-2 antigen NOT DETECTED.  Negative results are presumptive.  Negative results do not preclude SARS-CoV-2 infection and should not be used as the sole basis for treatment or other patient management decisions, including infection  control decisions, particularly in the presence of clinical signs and  symptoms consistent with COVID-19, or  in those who have been in contact with the virus.  Negative results  must be combined with clinical observations, patient history, and epidemiological information. The expected result is Negative. Fact Sheet for Patients: PodPark.tn Fact Sheet for Healthcare Providers: GiftContent.is This test is not yet approved or cleared by the [place] FDA and  has been authorized for detection and/or diagnosis of SARS-CoV-2 by FDA under an Emergency Use Authorization (EUA).  This EUA will remain in effect (meaning this test can be used) for the duration of  the COVID-19 de claration under Section 564(b)(1) of the Act, 21 U.S.C. section 360bbb-3(b)(1), unless the authorization is terminated or revoked sooner.     MR SACRUM SI JOINTS W WO CONTRAST  Result Date: 05/07/2019 CLINICAL DATA:  Nonhealing wound over the buttock. EXAM: MRI LUMBAR SPINE WITHOUT AND WITH CONTRAST TECHNIQUE: Multiplanar and multiecho pulse sequences of the lumbar spine were obtained without and with intravenous contrast. CONTRAST:  72m GADAVIST GADOBUTROL 1 MMOL/ML IV SOLN COMPARISON:  None. FINDINGS: Bones/Joint/Cartilage Large sacral decubitus ulcer extending to the cortex with mild sacrococcygeal edema concerning for mild osteomyelitis. No fracture or dislocation. Normal alignment. No joint effusion. Mild osteoarthritis of bilateral SI joints. Inter discal fluid at L3-4 and L4-5 which may be secondary to degenerative changes versus less likely discitis. Degenerative disease with disc height loss throughout the lumbar spine. Broad-based disc osteophyte complex at L3-4, L4-5 and L5-S1 with bilateral facet arthropathy. Severe bilateral foraminal stenosis at L4-5 and L5-S1. Ligaments, Muscles and Tendons Generalized muscle atrophy. T2 hyperintense signal within the right gluteus maximus muscle as can be seen with myositis versus neurogenic edema. Mild muscle edema and perifascial edema in the adductor muscles bilaterally. Soft tissue Generalized  soft tissue edema in the subcutaneous fat. More focal soft tissue edema and inflammatory changes overlying the posterior aspect of the left ilium likely reflecting more focal cellulitis. No drainable fluid collection or hematoma. 4 x 2 cm T1 and T2 hyperintense soft tissue mass inferior to the right posterior hip joint likely reflecting a simple lipoma. IMPRESSION: 1. Large sacral decubitus ulcer extending to the cortex with mild sacrococcygeal edema concerning for mild osteomyelitis. 2. T2 hyperintense signal within the right gluteus maximus muscle. Mild muscle edema and perifascial edema in the adductor muscles bilaterally. This may related to mild anasarca versus mild myositis. 3. Inter discal fluid at L3-4 and L4-5 which may be secondary to degenerative changes versus less likely discitis. 4. Diffuse lumbar spine spondylosis. Electronically Signed   By: HKathreen Devoid  On: 05/07/2019 14:41   DG Chest Portable 1 View  Result Date: 05/07/2019 CLINICAL DATA:  Chest pain, short of breath, hypotensive, sepsis EXAM: PORTABLE CHEST 1 VIEW COMPARISON:  11/06/2018 FINDINGS: The heart size and mediastinal contours are within normal limits. Both lungs are clear. The visualized skeletal structures are unremarkable. IMPRESSION: No active disease. Electronically Signed   By: MRanda NgoM.D.   On: 05/07/2019 15:02    Review of Systems  Constitutional: Negative for chills and fever.  HENT: Negative for ear discharge, hearing loss and sore throat.   Eyes: Negative for discharge.  Respiratory: Negative for cough and shortness of breath.   Cardiovascular: Negative for chest pain and leg swelling.  Gastrointestinal: Negative for abdominal pain, constipation, diarrhea, nausea and vomiting.  Musculoskeletal: Negative for myalgias and neck pain.  Skin: Negative for rash.  Allergic/Immunologic: Negative for environmental allergies.  Neurological: Negative for dizziness and seizures.  Hematological: Does not  bruise/bleed easily.  Psychiatric/Behavioral:  Negative for suicidal ideas.  All other systems reviewed and are negative.  Blood pressure 115/72, pulse (!) 110, temperature 100.3 F (37.9 C), temperature source Rectal, resp. rate (!) 23, height 5' 7"  (1.702 m), SpO2 99 %. Physical Exam  Constitutional: She is oriented to person, place, and time. Vital signs are normal. She appears well-developed and well-nourished.  Conversant No acute distress  Eyes: Lids are normal. No scleral icterus.  Pupils are equal round and reactive No lid lag Moist conjunctiva  Neck: No tracheal tenderness present. No thyromegaly present.  No cervical lymphadenopathy  Cardiovascular: Normal rate, regular rhythm and intact distal pulses.  No murmur heard. Respiratory: Effort normal and breath sounds normal. She has no wheezes. She has no rales.  GI: There is no hepatosplenomegaly. There is no abdominal tenderness. No hernia.  Neurological: She is alert and oriented to person, place, and time.  Normal gait and station  Skin: Skin is warm. No rash noted. No cyanosis. Nails show no clubbing.     Normal skin turgor  Psychiatric: Judgment normal.  Appropriate affect    Assessment/Plan: 74 year old female with a stage IV decubitus ulcer Sepsis Demyelinating disorder  1.  Upon visualizing the decubitus ulcer it appears to be approximately 10 x 10 cm in size with some undermining.  The area appears to have good granulation tissue.  There is no signs of infection or necrosis of the itself.  Would recommend continued antibiotics, wound nurse consult no need for surgical debridement at this time.  Please call for reconsultation if patient appears to have any further progression of the decubitus ulcer or signs of necrosis which required debridement.   Mary Frazier 05/07/2019, 5:32 PM

## 2019-05-07 NOTE — ED Triage Notes (Signed)
Pt BIB [facility] from MRI. Pt hx sacral wound contractures, anemia. Pt hypotensive 93/58, HR 116, 100% RA. Pt alert, oriented to person, place, and situation, disoriented to time. NAD noted.

## 2019-05-07 NOTE — H&P (Addendum)
History and Physical    Mary Frazier [id number] DOB: 18-Oct-1945 DOA: 05/07/2019  PCP: [name], MD   Patient coming from: MRI   Chief Complaint: Worsening sacral wound.   HPI: Mary Frazier is a 74 y.o. female with medical history significant of demyelinating disorder disorder, transfusion dependent chronic anemia, calorie protein malnutrition, chronic sacral wound and ambulatory dysfunction.  Apparently she had in the past lumbar laminectomy, cervical laminectomy, with postop infection, dural leak status post irrigation and wound VAC placement.  Her cultures then were positive for E. coli, Proteus and Enterococcus.  Patient is follow-up by [facility].  She was seen April 04, 2019 for follow-up anemia.  She then she was getting IV vancomycin for a polymicrobial infection of the lumbar spine, follow by infectious disease at [facility].   Yesterday patient was seen at [facility], for low hemoglobin 6.8, and it was plan to refer her to the emergency room today for a PRBC transfusion and at the same time get a scheduled sacral MRI to follow-up for her wound. Apparently not on antibiotics anymore.   Imaging showed positive osteomyelitis and myositis patient was referred for further admission and evaluation.  Patient complains of wound pain while dressing changes, burning in nature, moderate intensity, no improving or worsening factors.  Denies any associated fevers, chills, nausea or vomiting.  She has been mainly bedridden for the last 6 months.    ED Course: Patient was found hemodynamically stable, MRI for deep infection, she received IV vancomycin and ceftriaxone.  Refer for further admission evaluation.  Review of Systems:  1. General: No fevers, no chills, no weight gain or weight loss 2. ENT: No runny nose or sore throat, no hearing disturbances 3. Pulmonary: No dyspnea, cough, wheezing, or hemoptysis 4. Cardiovascular: No  angina, claudication, lower extremity edema, pnd or orthopnea 5. Gastrointestinal: No nausea or vomiting, no diarrhea or constipation 6. Hematology: No easy bruisability or frequent infections 7. Urology: No dysuria, hematuria or increased urinary frequency 8. Dermatology: No rashes. 9. Neurology: No seizures or paresthesias 10. Musculoskeletal: No joint pain or deformities  Past Medical History:  Diagnosis Date  . Alcohol abuse   . Demyelinating disorder ([place])   . Tobacco abuse     No past surgical history on file.   reports that she has been smoking cigarettes. She has been smoking about 0.50 packs per day. She has never used smokeless tobacco. She reports that she does not drink alcohol or use drugs.  No Known Allergies  No family history on file.   Prior to Admission medications   Medication Sig Start Date End Date Taking? Authorizing Provider  acetaminophen (TYLENOL) 500 MG tablet Take 1,000 mg by mouth every 8 (eight) hours as needed for mild pain or headache.     [provider]  Amino Acids-Protein Hydrolys (FEEDING SUPPLEMENT, PRO-STAT SUGAR FREE 64,) LIQD Take 30 mLs by mouth 2 (two) times daily.    [provider]  bisacodyl (DULCOLAX) 10 MG suppository Place 10 mg rectally daily as needed for moderate constipation.    [provider]  calcium carbonate (TUMS - DOSED IN MG ELEMENTAL CALCIUM) 500 MG chewable tablet Chew 500 mg by mouth daily.     [provider]  Cholecalciferol (D3-1000) 25 MCG (1000 UT) capsule Take 1,000 Units by mouth daily.    [provider]  folic acid (FOLVITE) 1 MG tablet Take 1 mg by mouth daily.    [provider]  iron polysaccharides (NIFEREX) 150 MG capsule Take 150 mg by mouth 2 (two) times daily. 12 pm and 1800    [provider]  magnesium hydroxide (MILK OF MAGNESIA) 400 MG/5ML suspension Take 30 mLs by mouth daily as needed for mild constipation.    [provider]    mirtazapine (REMERON) 30 MG tablet Take 30 mg by mouth at bedtime.    [provider]  Multiple Vitamins-Minerals (MULTIVITAMIN WITH MINERALS) tablet Take 1 tablet by mouth daily.    [provider]  NON FORMULARY Diet order: Regular Diet.    [provider]  Nutritional Supplements (ENSURE ENLIVE PO) Take 237 mLs by mouth 3 (three) times daily.    [provider]  Nutritional Supplements (NUTRITIONAL SUPPLEMENT PO) Take 1 each by mouth 2 (two) times daily. Magic Cup with lunch and dinner    [provider]  polyethylene glycol (MIRALAX / GLYCOLAX) 17 g packet Take 17 g by mouth 2 (two) times daily as needed for mild constipation.    [provider]  senna-docusate (SENOKOT-S) 8.6-50 MG tablet Take 2 tablets by mouth 2 (two) times daily.    [provider]  thiamine 100 MG tablet Take 100 mg by mouth daily.    [provider]  vitamin B-12 (CYANOCOBALAMIN) 1000 MCG tablet Take 1,000 mcg by mouth daily.    [provider]  vitamin C (ASCORBIC ACID) 500 MG tablet Take 500 mg by mouth 3 (three) times daily. With meals    [provider]    Physical Exam: Vitals:   05/07/19 1537 05/07/19 1545 05/07/19 1630 05/07/19 1645  BP: 97/67 104/77 110/67 115/72  Pulse: (!) 108 (!) 110 (!) 107 (!) 110  Resp: (!) 26 20 (!) 23 (!) 23  Temp:      TempSrc:      SpO2: 99% 99% 100% 99%  Height:        Vitals:   05/07/19 1537 05/07/19 1545 05/07/19 1630 05/07/19 1645  BP: 97/67 104/77 110/67 115/72  Pulse: (!) 108 (!) 110 (!) 107 (!) 110  Resp: (!) 26 20 (!) 23 (!) 23  Temp:      TempSrc:      SpO2: 99% 99% 100% 99%  Height:       General: deconditioned and ill looking appearing  Neurology: Awake and alert, non focal Head and Neck. Head normocephalic. Neck supple with no adenopathy or thyromegaly.   E ENT: mild pallor, no icterus, oral mucosa moist Cardiovascular: No JVD. S1-S2 present, rhythmic, no gallops,  rubs, or murmurs. No lower extremity edema. Pulmonary: positive breath sounds bilaterally, adequate air movement, no wheezing, rhonchi or rales. Gastrointestinal. Abdomen with no organomegaly, non tender, no rebound or guarding Skin. Large pressure ulcer about 10 cm diameter, stage IV, no frank purulence, but difficult to visualize.  Musculoskeletal: no joint deformities/ significant decrease muscle mass.     Labs on Admission: I have personally reviewed following labs and imaging studies  CBC: Recent Labs  Lab 05/07/19 1430  WBC 10.4  NEUTROABS 7.1  HGB 7.4*  HCT 24.7*  MCV 81.3  PLT [id number]*   Basic Metabolic Panel: Recent Labs  Lab 05/07/19 1430  NA 135  K 4.3  CL 97*  CO2 26  GLUCOSE 102*  BUN 22  CREATININE 0.42*  CALCIUM 9.5   GFR: Estimated Creatinine Clearance: 47.2 mL/min (A) (by C-G formula based on SCr of 0.42 mg/dL (L)). Liver Function Tests: Recent Labs  Lab 05/07/19 1430  AST 15  ALT 10  ALKPHOS 52  BILITOT 0.4  PROT 7.8  ALBUMIN 2.4*   No results for input(s): LIPASE, AMYLASE in the last 168 hours. No results for input(s): AMMONIA in the last 168 hours. Coagulation Profile: No results for input(s): INR, PROTIME in the last 168 hours. Cardiac Enzymes: No results for input(s): CKTOTAL, CKMB, CKMBINDEX, TROPONINI in the last 168 hours. BNP (last 3 results) No results for input(s): PROBNP in the last 8760 hours. HbA1C: No results for input(s): HGBA1C in the last 72 hours. CBG: No results for input(s): GLUCAP in the last 168 hours. Lipid Profile: No results for input(s): CHOL, HDL, LDLCALC, TRIG, CHOLHDL, LDLDIRECT in the last 72 hours. Thyroid Function Tests: No results for input(s): TSH, T4TOTAL, FREET4, T3FREE, THYROIDAB in the last 72 hours. Anemia Panel: No results for input(s): VITAMINB12, FOLATE, FERRITIN, TIBC, IRON, RETICCTPCT in the last 72 hours. Urine analysis:    Component Value Date/Time   COLORURINE AMBER (A) 05/07/2019 1613     APPEARANCEUR CLOUDY (A) 05/07/2019 1613   LABSPEC 1.027 05/07/2019 1613   PHURINE 5.0 05/07/2019 1613   GLUCOSEU NEGATIVE 05/07/2019 1613   HGBUR NEGATIVE 05/07/2019 1613   BILIRUBINUR NEGATIVE 05/07/2019 1613   KETONESUR 5 (A) 05/07/2019 1613   PROTEINUR NEGATIVE 05/07/2019 1613   NITRITE NEGATIVE 05/07/2019 1613   LEUKOCYTESUR LARGE (A) 05/07/2019 1613    Radiological Exams on Admission: MR SACRUM SI JOINTS W WO CONTRAST  Result Date: 05/07/2019 CLINICAL DATA:  Nonhealing wound over the buttock. EXAM: MRI LUMBAR SPINE WITHOUT AND WITH CONTRAST TECHNIQUE: Multiplanar and multiecho pulse sequences of the lumbar spine were obtained without and with intravenous contrast. CONTRAST:  76mL GADAVIST GADOBUTROL 1 MMOL/ML IV SOLN COMPARISON:  None. FINDINGS: Bones/Joint/Cartilage Large sacral decubitus ulcer extending to the cortex with mild sacrococcygeal edema concerning for mild osteomyelitis. No fracture or dislocation. Normal alignment. No joint effusion. Mild osteoarthritis of bilateral SI joints. Inter discal fluid at L3-4 and L4-5 which may be secondary to degenerative changes versus less likely discitis. Degenerative disease with disc height loss throughout the lumbar spine. Broad-based disc osteophyte complex at L3-4, L4-5 and L5-S1 with bilateral facet arthropathy. Severe bilateral foraminal stenosis at L4-5 and L5-S1. Ligaments, Muscles and Tendons Generalized muscle atrophy. T2 hyperintense signal within the right gluteus maximus muscle as can be seen with myositis versus neurogenic edema. Mild muscle edema and perifascial edema in the adductor muscles bilaterally. Soft tissue Generalized soft tissue edema in the subcutaneous fat. More focal soft tissue edema and inflammatory changes overlying the posterior aspect of the left ilium likely reflecting more focal cellulitis. No drainable fluid collection or hematoma. 4 x 2 cm T1 and T2 hyperintense soft tissue mass inferior to the right posterior  hip joint likely reflecting a simple lipoma. IMPRESSION: 1. Large sacral decubitus ulcer extending to the cortex with mild sacrococcygeal edema concerning for mild osteomyelitis. 2. T2 hyperintense signal within the right gluteus maximus muscle. Mild muscle edema and perifascial edema in the adductor muscles bilaterally. This may related to mild anasarca versus mild myositis. 3. Inter discal fluid at L3-4 and L4-5 which may be secondary to degenerative changes versus less likely discitis. 4. Diffuse lumbar spine spondylosis. Electronically Signed   By: Kathreen Devoid   On: 05/07/2019 14:41   DG Chest Portable 1 View  Result Date: 05/07/2019 CLINICAL DATA:  Chest pain, short of breath, hypotensive, sepsis EXAM: PORTABLE CHEST 1 VIEW COMPARISON:  11/06/2018 FINDINGS: The heart size and  mediastinal contours are within normal limits. Both lungs are clear. The visualized skeletal structures are unremarkable. IMPRESSION: No active disease. Electronically Signed   By: Randa Ngo M.D.   On: 05/07/2019 15:02    EKG: Independently reviewed.   Assessment/Plan Principal Problem:   Osteomyelitis (HCC) Active Problems:   Lumbar stenosis   Cervical stenosis of spine   Demyelinating disease of central nervous system ([place])   Sacral decubitus ulcer, stage IV (HCC)   Severe protein-calorie malnutrition Altamease Oiler: less than 60% of standard weight) ([place])   DNR (do not resuscitate) present on admission   74 year old female, nonambulatory, with severe calorie protein malnutrition, who has a chronic sacral wound stage IV, who had outpatient antibiotic therapy with IV vancomycin.  She has a persistent transfusion dependent chronic anemia.  No significant fevers or worsening pain at the sacral wound.  Today she had further evaluation with MRI showing worsening infection and deep extension into the bone and local regional muscles. Her blood pressures 110/67, she is tachycardic 110, tachypneic 23 breaths/min, oxygen  saturation 99% on room air.  She is ill looking appearing, very deconditioned, her lungs are clear to auscultation, heart S1-S2 present rhythmic, soft abdomen, no lower extremity edema, she has a very large stage IV sacral decubital ulcer. Sodium 135, potassium 4.3, chloride 97, bicarb 26, glucose 102, BUN 22, creatinine 0.42, white count 10.4, hemoglobin 7.4, hematocrit 24.7, platelets 1028.  SARS COVID-19 negative.  Urinalysis with more than 50 white cells, negative nitrates.  0-5 red cells.  Her chest radiograph was negative for infiltrates. Sacral MRI with a large sacral decubitus ulcer extending to the cortex with mild sacrococcygeal edema concerning for mild osteomyelitis.  Positive signs of muscle edema and perifascial edema in the abductor muscles bilaterally.  Patient will be admitted to the hospital with a working diagnosis of worsening stage IV sacral decubitus ulcer, complicated with osteomyelitis and myositis.  1.  Worsening stage IV sacral decubitus ulcer.  Patient will be admitted to the medical ward, will continue antibiotic therapy with vancomycin and Zosyn for suspected multidrug-resistant bacteria. Consult wound care.  Will follow up surgery recommendations.  Patient is nonambulatory and carries a poor prognosis.  2.  Chronic anemia with thrombocytosis.  Likely patient has been losing blood through her chronic wound.  Elevated platelet count is suggestive of iron deficiency.  Patient is on iron supplementation by mouth.  Will check iron panel, she likely will benefit from intravenous iron.  3.  Calorie protein malnutrition.  Severe, patient has poor prognosis, will consult nutrition for further recommendations.  4.  Depression.  Continue mirtazapine.   DVT prophylaxis:  Enoxaparin  Code Status:  DNR (per records)  Family Communication: I left a message to her aunt, unable to reach her at this point in time. Disposition Plan: medical ward  Consults called: surgery   Admission  status: Inpatient.     Sameul Tagle Gerome Apley MD [facility]   05/07/2019, 4:55 PM

## 2019-05-08 DIAGNOSIS — R627 Adult failure to thrive: Secondary | ICD-10-CM

## 2019-05-08 LAB — CBC
HCT: 23.2 % — ABNORMAL LOW (ref 36.0–46.0)
Hemoglobin: 7.1 g/dL — ABNORMAL LOW (ref 12.0–15.0)
MCH: 24.4 pg — ABNORMAL LOW (ref 26.0–34.0)
MCHC: 30.6 g/dL (ref 30.0–36.0)
MCV: 79.7 fL — ABNORMAL LOW (ref 80.0–100.0)
Platelets: 881 10*3/uL — ABNORMAL HIGH (ref 150–400)
RBC: 2.91 MIL/uL — ABNORMAL LOW (ref 3.87–5.11)
RDW: 18.6 % — ABNORMAL HIGH (ref 11.5–15.5)
WBC: 9.2 10*3/uL (ref 4.0–10.5)
nRBC: 0 % (ref 0.0–0.2)

## 2019-05-08 LAB — BASIC METABOLIC PANEL
Anion gap: 10 (ref 5–15)
BUN: 19 mg/dL (ref 8–23)
CO2: 26 mmol/L (ref 22–32)
Calcium: 9.3 mg/dL (ref 8.9–10.3)
Chloride: 98 mmol/L (ref 98–111)
Creatinine, Ser: 0.46 mg/dL (ref 0.44–1.00)
GFR calc Af Amer: >60 mL/min (ref >60)
GFR calc non Af Amer: >60 mL/min (ref >60)
Glucose, Bld: 93 mg/dL (ref 70–99)
Potassium: 4.5 mmol/L (ref 3.5–5.1)
Sodium: 134 mmol/L — ABNORMAL LOW (ref 135–145)

## 2019-05-08 LAB — IRON AND TIBC
Iron: 26 ug/dL — ABNORMAL LOW (ref 28–170)
Saturation Ratios: 15 % (ref 10.4–31.8)
TIBC: 168 ug/dL — ABNORMAL LOW (ref 250–450)
UIBC: 142 ug/dL

## 2019-05-08 LAB — TRANSFERRIN: Transferrin: 120 mg/dL — ABNORMAL LOW (ref 192–382)

## 2019-05-08 LAB — URINE CULTURE

## 2019-05-08 LAB — FERRITIN: Ferritin: 608 ng/mL — ABNORMAL HIGH (ref 11–307)

## 2019-05-08 MED ORDER — CHLORHEXIDINE GLUCONATE CLOTH 2 % EX PADS
6.0000 | MEDICATED_PAD | Freq: Every day | CUTANEOUS | Status: DC
  Administered 2019-05-08 – 2019-05-09 (×2): 6 via TOPICAL

## 2019-05-08 MED ORDER — ENSURE ENLIVE PO LIQD
237.0000 mL | Freq: Three times a day (TID) | ORAL | Status: DC
  Administered 2019-05-08 – 2019-05-10 (×5): 237 mL via ORAL

## 2019-05-08 MED ORDER — COLLAGENASE 250 UNIT/GM EX OINT
TOPICAL_OINTMENT | Freq: Every day | CUTANEOUS | Status: DC
  Filled 2019-05-08: qty 30

## 2019-05-08 MED ORDER — PRO-STAT SUGAR FREE PO LIQD
30.0000 mL | Freq: Three times a day (TID) | ORAL | Status: DC
  Administered 2019-05-08 – 2019-05-10 (×5): 30 mL via ORAL
  Filled 2019-05-08 (×5): qty 30

## 2019-05-08 NOTE — NC FL2 (Signed)
[place] LEVEL OF CARE SCREENING TOOL     IDENTIFICATION  Patient Name: Mary Frazier Birthdate: 03-02-1946 Sex: female Admission Date (Current Location): 05/07/2019  [facility] and [place] Number:  Herbalist and Address:  [facility], [place] [address], [place], [place] [address]      Provider Number: [id number]  Attending Physician Name and Address:  Modena Jansky, MD  Relative Name and Phone Number:       Current Level of Care: Hospital Recommended Level of Care: [place] Prior Approval Number:    Date Approved/Denied:   PASRR Number: [id number]  Discharge Plan: SNF    Current Diagnoses: Patient Active Problem List   Diagnosis Date Noted  . Osteomyelitis ([place]) 05/07/2019  . DNR (do not resuscitate) present on admission 05/07/2019  . Osteomyelitis of lumbar spine ([place]) 12/24/2018  . Sacral decubitus ulcer, stage IV ([place]) 12/24/2018  . Severe protein-calorie malnutrition Altamease Oiler: less than 60% of standard weight) ([place]) 12/24/2018  . Normocytic anemia 12/24/2018  . Vitamin D deficiency 12/24/2018  . Polymicrobial bacterial infection 12/21/2018  . Demyelinating disease of central nervous system ([place]) 12/02/2018  . Pressure ulcer, stage II ([place]) 12/02/2018  . Lumbar stenosis 11/24/2018  . Cervical stenosis of spine 11/24/2018  . Urinary retention 11/24/2018  . Tobacco abuse 11/24/2018  . Alcohol abuse 11/24/2018  . Hypokalemia 11/24/2018  . Hypomagnesemia 11/24/2018  . Hyponatremia 11/24/2018    Orientation RESPIRATION BLADDER Height & Weight     Self, Situation, Time  Normal Continent, Indwelling catheter Weight:   Height:  5\' 7"  (170.2 cm)  BEHAVIORAL SYMPTOMS/MOOD NEUROLOGICAL BOWEL NUTRITION STATUS      Continent Diet(see discharge summary)  AMBULATORY STATUS COMMUNICATION OF NEEDS Skin   Extensive Assist Verbally PU Stage and Appropriate Care, Skin abrasions, Other (Comment)(Aquacel sacrum & R  ankle wounds. Air mattress. Prevalon boots to reduce pressure BLE.  Foam dressings to protect BLE wounds. Santyl to provide enzymatic debridement of nonviable tissue L hip, L outer plantar foot, R outer foot R outer knee)       PU Stage 4 Dressing: (on sacrum)               Personal Care Assistance Level of Assistance  Bathing, Feeding, Dressing Bathing Assistance: Maximum assistance Feeding assistance: Limited assistance Dressing Assistance: Maximum assistance     Functional Limitations Info  Sight, Hearing, Speech Sight Info: Adequate Hearing Info: Adequate Speech Info: Adequate    SPECIAL CARE FACTORS FREQUENCY                       Contractures Contractures Info: Present    Additional Factors Info  Code Status, Allergies, Psychotropic Code Status Info: DNR Allergies Info: No Known Allergies Psychotropic Info: mirtazapine (REMERON) tablet 30 mg daily at bedtime PO         Current Medications (05/08/2019):  This is the current hospital active medication list Current Facility-Administered Medications  Medication Dose Route Frequency Provider Last Rate Last Admin  . acetaminophen (TYLENOL) tablet 1,000 mg  1,000 mg Oral Q8H PRN Arrien, Jimmy Picket, MD      . ascorbic acid (VITAMIN C) tablet 500 mg  500 mg Oral TID Tawni Millers, MD   500 mg at 05/08/19 1000  . bisacodyl (DULCOLAX) suppository 10 mg  10 mg Rectal Daily PRN Arrien, Jimmy Picket, MD      . calcium carbonate (TUMS - dosed in mg elemental calcium) chewable  tablet 500 mg  500 mg Oral Daily Arrien, Jimmy Picket, MD   500 mg at 05/08/19 1000  . Chlorhexidine Gluconate Cloth 2 % PADS 6 each  6 each Topical Daily Arrien, Jimmy Picket, MD   6 each at 05/08/19 1200  . cholecalciferol (VITAMIN D3) tablet 1,000 Units  1,000 Units Oral Daily Tawni Millers, MD   1,000 Units at 05/08/19 [id number]  . collagenase (SANTYL) ointment   Topical Daily Hongalgi, Anand D, MD      . enoxaparin  (LOVENOX) injection 40 mg  40 mg Subcutaneous Q24H Tawni Millers, MD   40 mg at 05/07/19 1929  . feeding supplement (PRO-STAT SUGAR FREE 64) liquid 30 mL  30 mL Oral BID Arrien, Jimmy Picket, MD   30 mL at 05/08/19 1001  . folic acid (FOLVITE) tablet 1 mg  1 mg Oral Daily Arrien, Jimmy Picket, MD   1 mg at 05/08/19 1000  . iron polysaccharides (NIFEREX) capsule 150 mg  150 mg Oral BID Arrien, Jimmy Picket, MD   150 mg at 05/08/19 1000  . magnesium hydroxide (MILK OF MAGNESIA) suspension 30 mL  30 mL Oral Daily PRN Arrien, Jimmy Picket, MD      . mirtazapine (REMERON) tablet 30 mg  30 mg Oral QHS Tawni Millers, MD   30 mg at 05/07/19 2104  . multivitamin with minerals tablet 1 tablet  1 tablet Oral Daily Arrien, Jimmy Picket, MD   1 tablet at 05/08/19 1000  . ondansetron (ZOFRAN) tablet 4 mg  4 mg Oral Q6H PRN Arrien, Jimmy Picket, MD       Or  . ondansetron [facility]) injection 4 mg  4 mg Intravenous Q6H PRN Arrien, Jimmy Picket, MD      . piperacillin-tazobactam (ZOSYN) IVPB 3.375 g  3.375 g Intravenous Q8H Duanne Limerick, RPH 12.5 mL/hr at 05/08/19 0959 3.375 g at 05/08/19 0959  . polyethylene glycol (MIRALAX / GLYCOLAX) packet 17 g  17 g Oral BID PRN Arrien, Jimmy Picket, MD      . senna-docusate (Senokot-S) tablet 2 tablet  2 tablet Oral BID Arrien, Jimmy Picket, MD   2 tablet at 05/07/19 2102  . thiamine tablet 100 mg  100 mg Oral Daily Arrien, Jimmy Picket, MD   100 mg at 05/08/19 1000  . vancomycin (VANCOCIN) IVPB 1000 mg/200 mL premix  1,000 mg Intravenous Q24H Tawni Millers, MD   Stopped at 05/07/19 1659  . vitamin B-12 (CYANOCOBALAMIN) tablet 1,000 mcg  1,000 mcg Oral Daily Arrien, Jimmy Picket, MD   1,000 mcg at 05/07/19 2102     Discharge Medications: Please see discharge summary for a list of discharge medications.  Relevant Imaging Results:  Relevant Lab Results:   Additional Information SS# [place], [place]

## 2019-05-08 NOTE — TOC Initial Note (Signed)
Transition of Care [facility]) - Initial/Assessment Note    Patient Details  Name: Mary Frazier MRN: [id number] Date of Birth: [birth year]  Transition of [facility]) CM/SW Contact:    Alexander Mt, LCSW Phone Number: 05/08/2019, 1:13 PM  Clinical Narrative:                 CSW met with pt at bedside. Accompanied by Jeannetta Nap, CSW shadowing this Probation officer. Introduced self, role, reason for visit. Pt from [facility]. She has been a resident there for at almost a year. CSW confirmed pt can return under her Medicaid per [place], admissions liaison. Pt states she was getting up but lately has not been as active and hasn't left her room (this Probation officer also aware that SNF had an outbreak of COVID and pt was insolation). Pt goal is to "get her limbs moving." We discussed therapies. Pt aware of her large wound and would like more information from the MD before she contacts her family. CSW offered to contact her family but pt declines stating that she does not want to worry them (she mentions being in contact with both an aunt who is elderly and her daughter who she says cannot provide her with care). At this time plan for pt to return to SNF when medically ready. Discussed that [facility] team will be back to visit and discuss discharge timing. Pt was left eating chocolate chip cookie in the room.   Expected Discharge Plan: Skilled Nursing Facility Barriers to Discharge: Continued Medical Work up   Patient Goals and CMS Choice Patient states their goals for this hospitalization and ongoing recovery are:: Get her limbs working [employer].gov Compare Post Acute Care list provided to:: (LTC SNF resident) Choice offered to / list presented to : Patient  Expected Discharge Plan and Services Expected Discharge Plan: [place] In-house Referral: Clinical Social Work Discharge Planning Services: CM Consult Post Acute Care Choice: Resumption of Svcs/PTA Provider Living arrangements for the past 2 months:  [place]  Prior Living Arrangements/Services Living arrangements for the past 2 months: [place] Lives with:: Facility Resident Patient language and need for interpreter reviewed:: Yes(No needs) Do you feel safe going back to the place where you live?: Yes      Need for Family Participation in Patient Care: Yes (Comment)(assistance with daily cares, support) Care giver support system in place?: Yes (comment)(facility staff, Aunt/ Daughter)   Criminal Activity/Legal Involvement Pertinent to Current Situation/Hospitalization: No - Comment as needed   Permission Sought/Granted Permission sought to share information with : Family Supports Permission granted to share information with : No(Patient states she will update family)   Emotional Assessment Appearance:: Appears older than stated age Attitude/Demeanor/Rapport: Engaged Affect (typically observed): Accepting, Appropriate, Flat Orientation: : Oriented to Self, Oriented to  Time, Oriented to Situation, Fluctuating Orientation (Suspected and/or reported Sundowners) Alcohol / Substance Use: Tobacco Use Psych Involvement: No (comment)(unknown at this time)  Admission diagnosis:  Osteomyelitis ([place]) [M86.9] Osteomyelitis of sacrum ([place]) [M46.28] Patient Active Problem List   Diagnosis Date Noted  . Osteomyelitis ([place]) 05/07/2019  . DNR (do not resuscitate) present on admission 05/07/2019  . Osteomyelitis of lumbar spine ([place]) 12/24/2018  . Sacral decubitus ulcer, stage IV ([place]) 12/24/2018  . Severe protein-calorie malnutrition Altamease Oiler: less than 60% of standard weight) ([place]) 12/24/2018  . Normocytic anemia 12/24/2018  . Vitamin D deficiency 12/24/2018  . Polymicrobial bacterial infection 12/21/2018  . Demyelinating disease of central nervous system ([place]) 12/02/2018  .  Pressure ulcer, stage II ([place]) 12/02/2018  . Lumbar stenosis 11/24/2018  . Cervical stenosis of spine 11/24/2018  . Urinary retention  11/24/2018  . Tobacco abuse 11/24/2018  . Alcohol abuse 11/24/2018  . Hypokalemia 11/24/2018  . Hypomagnesemia 11/24/2018  . Hyponatremia 11/24/2018   PCP:  [name], MD Pharmacy:   [facility] [place] [address] [place] [address] Phone: [phone] Fax: [phone]    Readmission Risk Interventions Readmission Risk Prevention Plan 05/08/2019  Transportation Screening Complete  PCP or Specialist Appt within 5-7 Days Not Complete  Not Complete comments SNF resident  Home Care Screening Not Complete  Home Care Screening Not Completed Comments SNF resident  Medication Review (RN CM) Referral to Pharmacy  Some recent data might be hidden

## 2019-05-08 NOTE — Consult Note (Addendum)
[facility] Nurse Consult Note: Reason for Consult: Consult requested for multiple wounds.  Surgical team assessed sacrum wound, which has osteomyelitis, yesterday and did not feel it needed debridement, and referred [facility] nurses for topical treatment recommendations. Pt is very emaciated and immobile with multiple systemic factors which can impair healing.  Wound type: Sacrum with chronic stage 4 pressure injury; 10X10X1cm with 3 cm undermining.  Wound red red and moist, bone palpable, mod amt tan drainage, no odor.   Left buttock near sacrum wound with 1X1cm darker-colored skin; deep tissue pressure injury. Left hip with unstageable pressure injury; 2X2.5cm, loose slough/eschar, small amt tan drainage, no odor Left knee with stage 2 pressure injury: 1X1X.1cm, pink and dry Left outer foot with full thickness wound; 1X1cm, 100% eschar/slough, small amt tan drainage, no odor Left inner toe 1X1.5cm dry full thickness callous, dark brown with small amt drainage Left ankle with darker colored deep tissue injury; .5X.5cm Left heel 1.5X1.5X.1cm stage 2 pressure injury Right outer knee with unstageable pressure injury; 1X.5cm 100% eschar, small amt tan drainage Right knee with partial thickness abrasion; 3X1X.1cm, pink and dry Right hip stage 2 3X.5X.1cm, pink and dry Right heel 3X2cm unstageable pressure injury, 100% eschar; it is best practice to leave dry stable eschar in place Right inner ankle with stage 4 pressure injury; 4X3X.3cm, red and moist, mod amt tan drainage, bone palpable Right outer foot with 2 areas of unstageable pressure injuries; moist eschar 5X3cm and 3X2cm Pressure Injury POA: Yes Dressing procedure/placement/frequency: Topical treatment orders provided for bedside nurses to perform daily as follows:  Aquacel to sacrum and right ankle wounds to absorb drainage and provide antimicrobial benefits.  Air mattress to reduce pressure. Prevalon boots to reduce pressure to BLE.  Foam dressings to  protect BLE wounds from further injury. Santyl to provide enzymatic debridement of nonviable tissue to left hip, left outer plantar foot, right outer foot and right outer knee One hour spent performing this consult.  Please re-consult if further assistance is needed.  Thank-you,  Julien Girt MSN, [place], [place], [place], [place]

## 2019-05-08 NOTE — Progress Notes (Deleted)
EKG done( in shadow chart). Machine will not transmit.

## 2019-05-08 NOTE — Progress Notes (Addendum)
PROGRESS NOTE   Mary Frazier  [id number]    DOB: 1945/05/09    DOA: 05/07/2019  PCP: [name], MD   I have briefly reviewed patients previous medical records in [facility].  Chief Complaint:   Chief Complaint  Patient presents with  . Wound Check    Brief Narrative:  74 year old female, reportedly lives with her daughter and son-in-law, reportedly nonambulatory for more than a year, mostly bedbound, PMH of demyelinating disorder, transfusion dependent chronic anemia, protein calorie malnutrition, chronic sacral wound, lumbar laminectomy, cervical laminectomy with postop infection, dural leak s/p irrigation and wound VAC placement, cultures then positive for E. coli, Proteus and Enterococcus, seen at [facility] care on day prior to admission, noted low hemoglobin of 6.8 and referred to ED for PRBC transfusion and at the same time get a scheduled sacral MRI to follow-up on her sacral wound.  Imaging suggested possible osteomyelitis and myositis.  General surgery consulted and did not see any surgical needs.  PMT consulted for goals of care.   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  Reportedly on outpatient antibiotic therapy with IV vancomycin.  MRI findings as indicated above.  General surgery consulted on 2/9 and did not feel that there were any signs of infection or necrosis and recommended continued antibiotics, WOC RN consultation without need for surgical debridement at this time.  Continue empirically started IV vancomycin and Zosyn.  [place] RN consult appreciated, discussed with her outside  patient's room, largest stage IV sacral wound not overtly infected and patient has multiple other wounds as listed in her note from 2/10.  Adult failure to thrive: Multifactorial due to advanced age, severe protein calorie malnutrition, nonambulatory, multiple wounds on body.  Overall poor prognosis.  PMT consulted for goals of care.  Patient does not want me to call or discussed with her daughter at this time.  Severe protein calorie malnutrition: Encourage oral intake.  RD consulted.  Depression: Continue mirtazapine.  Anemia of chronic disease: Hemoglobin down to 7.1.  Anemia panel appreciated.  Ferritin 608 may be an acute phase reactant.  Asymptomatic.  Her hemoglobin has mostly been in the 7 g range over the last month and has gradually drifted down.  Follow CBC in a.m. and transfuse if hemoglobin 7 g or less.  Thrombocytosis, possibly reactive versus due to iron deficiency anemia.  Demyelinating disorder, nonambulatory with contractures  Body mass index is 16.23 kg/m.  Nutritional Status Nutrition Problem: Severe Malnutrition Etiology: chronic illness(demyelinating disorder) Signs/Symptoms: severe muscle depletion, severe fat depletion, energy intake < 75% for > or equal to 1 month Interventions: Ensure Enlive (each supplement provides 350kcal and 20 grams of protein), MVI, Prostat  DVT prophylaxis: Enoxaparin Code Status: DNR Family Communication: Patient does not want me to call her family. Disposition:  . Patient came from: Home           . Anticipated d/c place: Unclear.  Patient states that her daughter will not be able to care for her.  SNF possible. . Barriers to d/c: Pending PMT input.   Consultants:   General surgery PMT  Procedures:  None  Antimicrobials:   IV vancomycin and Zosyn.   Subjective:  "I feel okay".  Much of history as noted above.  Currently denies complaints or pain.  Small appetite.  Willing to try to eat more.  Objective:   Vitals:    05/07/19 1826 05/07/19 2129 05/08/19 0543 05/08/19 1431  BP: 107/68 103/61 106/67 (!) 90/56  Pulse: (!) 103 (!) 107 (!) 102 88  Resp: 20 14 14    Temp: 98.6 F (37 C) 100.2 F (37.9 C) 97.6 F (36.4 C) 99.1 F (37.3 C)  TempSrc: Oral Oral Oral Oral  SpO2: 100% 100% (!) 76% 100%  Height:        General exam: Elderly female, small built, frail, chronically ill looking lying comfortably propped up in bed without distress. Respiratory system: Clear to auscultation. Respiratory effort normal. Cardiovascular system: S1 & S2 heard, RRR. No JVD, murmurs, rubs, gallops or clicks. No pedal edema.  Telemetry personally reviewed: Sinus tachycardia in the 100s. Gastrointestinal system: Abdomen is nondistended, soft and nontender. No organomegaly or masses felt. Normal bowel sounds heard. Central nervous system: Alert and oriented. No focal neurological deficits. Extremities: Symmetric 4 x 5 power in upper extremities: Lower extremities with contractures, mostly in the right lower extremity and grade 0 or 1 x 5 power in lower extremities. Skin: Since WOC RN has just examined, I did not expose any of her wounds.  Wound exam as per [place] RN note from 2/10. Psychiatry: Judgement and insight appear normal. Mood & affect appropriate.     Data Reviewed:   I have personally reviewed following labs and imaging studies   CBC: Recent Labs  Lab 05/07/19 1430 05/08/19 0138  WBC 10.4 9.2  NEUTROABS 7.1  --   HGB 7.4* 7.1*  HCT 24.7* 23.2*  MCV 81.3 79.7*  PLT 1,028* 881*    Basic Metabolic Panel: Recent Labs  Lab 05/07/19 1430 05/08/19 0138  NA 135 134*  K 4.3 4.5  CL 97* 98  CO2 26 26  GLUCOSE 102* 93  BUN 22 19  CREATININE 0.42* 0.46  CALCIUM 9.5 9.3    Liver Function Tests: Recent Labs  Lab 05/07/19 1430  AST 15  ALT 10  ALKPHOS 52  BILITOT 0.4  PROT 7.8  ALBUMIN 2.4*    CBG: No results for input(s): GLUCAP in the last 168 hours.  Microbiology Studies:   Recent Results  (from the past 240 hour(s))  Culture, blood (routine x 2)     Status: None (Preliminary result)   Collection Time: 05/07/19  2:30 PM   Specimen: BLOOD LEFT ARM  Result Value Ref Range Status   Specimen Description BLOOD LEFT ARM  Final   Special Requests   Final    BOTTLES DRAWN AEROBIC AND ANAEROBIC Blood Culture adequate volume   Culture   Final    NO GROWTH < 24 HOURS Performed at [facility], [place] [address], [place], [place] [address]    Report Status PENDING  Incomplete  Culture, blood (routine x 2)     Status: None (Preliminary result)   Collection Time: 05/07/19  2:35 PM   Specimen: BLOOD LEFT HAND  Result Value Ref Range Status   Specimen Description BLOOD LEFT HAND  Final   Special Requests   Final    BOTTLES DRAWN AEROBIC AND ANAEROBIC Blood Culture adequate volume   Culture   Final    NO GROWTH < 24 HOURS Performed at [facility], [place]  [address] [place], [place] [address]    Report Status PENDING  Incomplete  Urine culture     Status: Abnormal   Collection Time: 05/07/19  4:13 PM   Specimen: Urine, Random  Result Value Ref Range Status   Specimen Description URINE, RANDOM  Final   Special Requests   Final    NONE Performed at [facility], [address] [place], [place] [address]    Culture MULTIPLE SPECIES PRESENT, SUGGEST RECOLLECTION (A)  Final   Report Status 05/08/2019 FINAL  Final     Radiology Studies:  No results found.   Scheduled Meds:   . vitamin C  500 mg Oral TID  . calcium carbonate  500 mg Oral Daily  . Chlorhexidine Gluconate Cloth  6 each Topical Daily  . cholecalciferol  1,000 Units Oral Daily  . collagenase   Topical Daily  . enoxaparin (LOVENOX) injection  40 mg Subcutaneous Q24H  . feeding supplement (ENSURE ENLIVE)  237 mL Oral TID BM  . feeding supplement (PRO-STAT SUGAR FREE 64)  30 mL Oral TID WC  . folic acid  1 mg Oral Daily  . iron polysaccharides  150 mg Oral BID  . mirtazapine  30 mg Oral QHS  .  multivitamin with minerals  1 tablet Oral Daily  . senna-docusate  2 tablet Oral BID  . thiamine  100 mg Oral Daily  . vitamin B-12  1,000 mcg Oral Daily    Continuous Infusions:   . piperacillin-tazobactam (ZOSYN)  IV 3.375 g (05/08/19 1731)  . vancomycin 1,000 mg (05/08/19 1455)     LOS: 1 day     Vernell Leep, MD, [place], [facility]. [facility]    To contact the attending provider between 7A-7P or the covering provider during after hours 7P-7A, please log into the web site [email or web address] and access using [place]  password for that web site. If you do not have the password, please call the hospital operator.  05/08/2019, 7:18 PM

## 2019-05-08 NOTE — Progress Notes (Signed)
Initial Nutrition Assessment  DOCUMENTATION CODES:   Severe malnutrition in context of chronic illness, Underweight  INTERVENTION:   -30 ml Prostat TID with meals, each supplement provides 100 kcals and 15 grams protein -MVI with minerals daily -Ensure Enlive po TID, each supplement provides 350 kcal and 20 grams of protein - If pt desires aggressive care, would likely benefit from supplemental enteral nutrition support to assist in meeting nutritional needs.  NUTRITION DIAGNOSIS:   Severe Malnutrition related to chronic illness(demyelinating disorder) as evidenced by severe muscle depletion, severe fat depletion, energy intake < 75% for > or equal to 1 month.  GOAL:   Patient will meet greater than or equal to 90% of their needs  MONITOR:   PO intake, Supplement acceptance, Labs, Weight trends, Skin, I & O's  REASON FOR ASSESSMENT:   Consult Assessment of nutrition requirement/status  ASSESSMENT:   Mary Frazier is a 74 y.o. female with medical history significant of demyelinating disorder disorder, transfusion dependent chronic anemia, calorie protein malnutrition, chronic sacral wound and ambulatory dysfunction.  Pt admitted with osteomyelitis and chronic worsening stage IV pressure injury to sacrum.   Reviewed I/O's: +443 ml x 24 hours  UOP: 400 ml x 24 hours  Per general surgery notes, no plans to operative management at this time.   Spoke with pt at bedside, who was pleasant and in good spirits today. She reports a general decline in health over the past year, which she attributes to the death of her mother. Pt shares that she has "lost a lot of weight", however, unsure of UBW of how much weight she has lost.   Pt reports intake is poor. Observed lunch tray (which was untouched) and breakfast tray, which she consumed only juices. Pt feels more comfortable eating than drinking at this time ("it's all this food on top of food all the time and there is no way I can eat  it all"). Pt really likes Ensure supplements and is amenable to consuming them. Pt reports she was consuming these PTA. Per her report, she consumed a small breakfast (cereal with a small amount of milk), small lunch (half sandwich), and dinner (meat, starch, and vegetable) routinely PTA.  Discussed with pt importance of good meal and supplement intake to promote healing.   Palliative care following for goals of care discussions. If pt desires aggressive care, would likely benefit from supplemental enteral nutrition support to assist in meeting nutritional needs.  Labs reviewed: Na: 134.   NUTRITION - FOCUSED PHYSICAL EXAM:    Most Recent Value  Orbital Region  Severe depletion  Upper Arm Region  Severe depletion  Thoracic and Lumbar Region  Severe depletion  Buccal Region  Severe depletion  Temple Region  Severe depletion  Clavicle Bone Region  Severe depletion  Clavicle and Acromion Bone Region  Severe depletion  Scapular Bone Region  Severe depletion  Dorsal Hand  Severe depletion  Patellar Region  Severe depletion  Anterior Thigh Region  Severe depletion  Posterior Calf Region  Severe depletion  Edema (RD Assessment)  None  Hair  Reviewed  Eyes  Reviewed  Mouth  Reviewed  Skin  Reviewed  Nails  Reviewed       Diet Order:   Diet Order            Diet regular Room service appropriate? Yes; Fluid consistency: Thin  Diet effective now              EDUCATION NEEDS:   Education needs  have been addressed  Skin:  Skin Assessment: Skin Integrity Issues: Skin Integrity Issues:: Stage IV, DTI, Unstageable, Stage II, Other (Comment) DTI: lt buttock, lt heel, Stage II: lt knee, lt heel, lt hip Stage IV: sacrum, rt inner ankle Unstageable: lt hip, rt outer foot, rt heel Other: full thickness lt outer foot, lt inner toe,  Last BM:  05/07/19  Height:   Ht Readings from Last 1 Encounters:  05/07/19 5\' 7"  (1.702 m)    Weight:   Wt Readings from Last 1 Encounters:   05/06/19 47 kg    Ideal Body Weight:  61.4 kg  BMI:  Body mass index is 16.23 kg/m.  Estimated Nutritional Needs:   Kcal:  1900-2100  Protein:  105-120 grams  Fluid:  > 1.9 L    Loistine Chance, RD, LDN, [place] Registered Dietitian II Certified Diabetes Care and Education Specialist Please refer to [facility] for RD and/or RD on-call/weekend/after hours pager

## 2019-05-09 LAB — PREPARE RBC (CROSSMATCH)

## 2019-05-09 LAB — CBC
HCT: 22.2 % — ABNORMAL LOW (ref 36.0–46.0)
Hemoglobin: 7 g/dL — ABNORMAL LOW (ref 12.0–15.0)
MCH: 24.5 pg — ABNORMAL LOW (ref 26.0–34.0)
MCHC: 31.5 g/dL (ref 30.0–36.0)
MCV: 77.6 fL — ABNORMAL LOW (ref 80.0–100.0)
Platelets: 868 10*3/uL — ABNORMAL HIGH (ref 150–400)
RBC: 2.86 MIL/uL — ABNORMAL LOW (ref 3.87–5.11)
RDW: 18.3 % — ABNORMAL HIGH (ref 11.5–15.5)
WBC: 9.3 10*3/uL (ref 4.0–10.5)
nRBC: 0 % (ref 0.0–0.2)

## 2019-05-09 LAB — SARS CORONAVIRUS 2 (TAT 6-24 HRS): SARS Coronavirus 2: NEGATIVE

## 2019-05-09 LAB — PATHOLOGIST SMEAR REVIEW

## 2019-05-09 MED ORDER — SODIUM CHLORIDE 0.9% IV SOLUTION: Freq: Once | INTRAVENOUS | Status: DC

## 2019-05-09 NOTE — Progress Notes (Addendum)
PROGRESS NOTE   Mary Frazier  [id number]    DOB: 05-Oct-1945    DOA: 05/07/2019  PCP: [name], MD   I have briefly reviewed patients previous medical records in [facility].  Chief Complaint:   Chief Complaint  Patient presents with  . Wound Check    Brief Narrative:  74 year old female, resident of [facility] from SNF for more than a year (yesterday told me that she was actually living at home!), reportedly nonambulatory for more than a year, mostly bedbound, PMH of demyelinating disorder, transfusion dependent chronic anemia, protein calorie malnutrition, chronic sacral wound, past history of lumbar laminectomy, cervical laminectomy with postop infection, dural leak s/p irrigation and wound VAC placement, cultures then positive for E. coli, Proteus and Enterococcus, seen at [facility] care on day prior to admission, noted low hemoglobin of 6.8 and referred to ED for PRBC transfusion and at the same time get a scheduled sacral MRI to follow-up on her sacral wound.  Imaging suggested possible osteomyelitis and myositis.  General surgery consulted and did not see any surgical needs.  PMT consulted for goals of care.   Assessment & Plan:  Principal Problem:   Osteomyelitis ([place]) Active Problems:   Lumbar stenosis   Cervical stenosis of spine   Demyelinating disease of central nervous system ([place])   Sacral decubitus ulcer, stage IV (HCC)   Severe protein-calorie malnutrition Altamease Oiler: less than 60% of standard weight) ([place])   DNR (do not resuscitate) present on admission   Stage IV sacral decubitus ulcer: Present on admission.  Reportedly on outpatient antibiotic therapy with IV vancomycin-unclear if this was ongoing prior to admission or had been discontinued, unable to make out on chart review.  MRI findings as indicated above.  General surgery consulted on 2/9 and did not feel that there were any signs of infection or necrosis and recommended continued antibiotics, WOC RN  consultation without need for surgical debridement at this time.  Continue empirically started IV vancomycin and Zosyn.  [place] RN consult appreciated, discussed with her 2/10, largest stage IV sacral wound not overtly infected and patient has multiple other wounds as listed in her note from 2/10.  I examined sacral wound with RN today.  Does not appear overtly infected or draining.  Please see picture below.  I do not think that she can tolerate any kind of surgical interventions.  I have requested ID consult to see tomorrow to determine antibiotic management with options including no further antibiotics versus prolonged suppressive antibiotics?  Oral doxycycline.  Adult failure to thrive: Multifactorial due to advanced age, severe protein calorie malnutrition, nonambulatory, multiple wounds on body.  Overall poor prognosis.  PMT consulted for goals of care-input pending, likely that there services been busy.  Patient does not want me to call or discussed with her daughter at this time.  Unclear if any goals of discussion were done at SNF.  Severe protein calorie malnutrition: Encourage oral intake.  RD consulted.  Depression: Continue mirtazapine.  Anemia of chronic disease: Hemoglobin down to 7.1.  Anemia panel appreciated.  Ferritin 608 may be an acute phase reactant.  Asymptomatic.  Her hemoglobin has mostly been in the 7 g range over the last month and has gradually drifted down.  Hemoglobin 7 g today, likely to worsen.  Agreeable to transfusing 1 unit PRBC.  Thrombocytosis, possibly reactive versus due to iron deficiency anemia.  Demyelinating disorder, nonambulatory with contractures  Body mass index is 16.23 kg/m.  Nutritional Status Nutrition Problem:  Severe Malnutrition Etiology: chronic illness(demyelinating disorder) Signs/Symptoms: severe muscle depletion, severe fat depletion, energy intake < 75% for > or equal to 1 month Interventions: Ensure Enlive (each supplement provides 350kcal  and 20 grams of protein), MVI, Prostat  DVT prophylaxis: Enoxaparin Code Status: DNR Family Communication: Patient does not want me to call her family. Disposition:  . Patient came from: SNF         . Anticipated d/c place: Return to SNF . Barriers to d/c: Unfortunate elderly female with severe malnutrition, nonambulatory with contractures, multiple wounds on the body especially stage IV sacral decubitus which may have chronic infection, anemia and overall progressive failure to thrive, ideally to get a palliative care consultation to determine goals of care-input pending, transfuse today, clarification of antibiotic regiment tomorrow by ID and possible DC back to [facility] 2/12.   Consultants:   General surgery PMT ID to see 2/12.  Procedures:   Patient has a Foley catheter.  Unsure if she came from SNF with this or this was placed in the hospital.  I discussed with RN, advised her to verify and if placed in the hospital then advised her to discontinue Foley.  Antimicrobials:   IV vancomycin and Zosyn.   Subjective:  Today she stated that she actually lives at the nursing home for several months and before that she was at home.  Denies complaints.  No pain reported.  Objective:   Vitals:   05/09/19 0559 05/09/19 1037 05/09/19 1410 05/09/19 1415  BP: 95/63 (!) 90/57 (!) 98/59   Pulse: (!) 107 99 (!) 105 96  Resp: 18 19 14    Temp: 99.4 F (37.4 C) 98.8 F (37.1 C) 98.8 F (37.1 C)   TempSrc: Oral Oral Oral   SpO2: 100% 100% 100%   Height:        General exam: Elderly female, small built, frail, chronically ill looking lying comfortably propped up in bed without distress. Respiratory system: Clear to auscultation.  No increased work of breathing. Cardiovascular system: S1 & S2 heard, RRR. No JVD, murmurs, rubs, gallops or clicks. No pedal edema.   Gastrointestinal system: Abdomen is nondistended, soft and nontender. No organomegaly or masses felt. Normal bowel sounds  heard. Central nervous system: Alert and oriented.  No cranial nerve deficits. Extremities: Symmetric 4 x 5 power in upper extremities: Lower extremities with contractures, mostly in the right lower extremity and grade 0 or 1 x 5 power in lower extremities. Skin: Wound exam as per WOC RN note from 2/10.  Along with patient's female RN, I examined her large sacral decubitus ulcer with findings as in picture below from 2/11.  No overt infection or drainage. Psychiatry: Judgement and insight appear normal. Mood & affect appropriate.       Data Reviewed:   I have personally reviewed following labs and imaging studies   CBC: Recent Labs  Lab 05/07/19 1430 05/08/19 0138 05/09/19 0238  WBC 10.4 9.2 9.3  NEUTROABS 7.1  --   --   HGB 7.4* 7.1* 7.0*  HCT 24.7* 23.2* 22.2*  MCV 81.3 79.7* 77.6*  PLT 1,028* 881* 868*    Basic Metabolic Panel: Recent Labs  Lab 05/07/19 1430 05/08/19 0138  NA 135 134*  K 4.3 4.5  CL 97* 98  CO2 26 26  GLUCOSE 102* 93  BUN 22 19  CREATININE 0.42* 0.46  CALCIUM 9.5 9.3    Liver Function Tests: Recent Labs  Lab 05/07/19 1430  AST 15  ALT 10  ALKPHOS  52  BILITOT 0.4  PROT 7.8  ALBUMIN 2.4*    CBG: No results for input(s): GLUCAP in the last 168 hours.  Microbiology Studies:   Recent Results (from the past 240 hour(s))  Culture, blood (routine x 2)     Status: None (Preliminary result)   Collection Time: 05/07/19  2:30 PM   Specimen: BLOOD LEFT ARM  Result Value Ref Range Status   Specimen Description BLOOD LEFT ARM  Final   Special Requests   Final    BOTTLES DRAWN AEROBIC AND ANAEROBIC Blood Culture adequate volume   Culture   Final    NO GROWTH 2 DAYS Performed at [facility], [address] [place], [place] [address]    Report Status PENDING  Incomplete  Culture, blood (routine x 2)     Status: None (Preliminary result)   Collection Time: 05/07/19  2:35 PM   Specimen: BLOOD LEFT HAND  Result Value Ref Range Status    Specimen Description BLOOD LEFT HAND  Final   Special Requests   Final    BOTTLES DRAWN AEROBIC AND ANAEROBIC Blood Culture adequate volume   Culture   Final    NO GROWTH 2 DAYS Performed at [facility], [place] [address], [place], [place] [address]    Report Status PENDING  Incomplete  Urine culture     Status: Abnormal   Collection Time: 05/07/19  4:13 PM   Specimen: Urine, Random  Result Value Ref Range Status   Specimen Description URINE, RANDOM  Final   Special Requests   Final    NONE Performed at [facility], [place] [address], [place], [place] [address]    Culture MULTIPLE SPECIES PRESENT, SUGGEST RECOLLECTION (A)  Final   Report Status 05/08/2019 FINAL  Final     Radiology Studies:  No results found.   Scheduled Meds:   . sodium chloride   Intravenous Once  . vitamin C  500 mg Oral TID  . calcium carbonate  500 mg Oral Daily  . Chlorhexidine Gluconate Cloth  6 each Topical Daily  . cholecalciferol  1,000 Units Oral Daily  . collagenase   Topical Daily  . enoxaparin (LOVENOX) injection  40 mg Subcutaneous Q24H  . feeding supplement (ENSURE ENLIVE)  237 mL Oral TID BM  . feeding supplement (PRO-STAT SUGAR FREE 64)  30 mL Oral TID WC  . folic acid  1 mg Oral Daily  . iron polysaccharides  150 mg Oral BID  . mirtazapine  30 mg Oral QHS  . multivitamin with minerals  1 tablet Oral Daily  . senna-docusate  2 tablet Oral BID  . thiamine  100 mg Oral Daily  . vitamin B-12  1,000 mcg Oral Daily    Continuous Infusions:   . piperacillin-tazobactam (ZOSYN)  IV 3.375 g (05/09/19 0958)  . vancomycin 1,000 mg (05/09/19 1428)     LOS: 2 days     Vernell Leep, MD, [place], [facility]. [facility]    To contact the attending provider between 7A-7P or the covering provider during after hours 7P-7A, please log into the web site [email or web address] and access using universal [place] password for that web site. If you do not have the password, please call the  hospital operator.  05/09/2019, 4:33 PM

## 2019-05-09 NOTE — TOC Progression Note (Signed)
Transition of Care [facility]) - Progression Note    Patient Details  Name: Mary Frazier MRN: [id number] Date of Birth: 06-16-1945  Transition of Care [facility]) CM/SW [place], [place] Phone Number: 05/09/2019, 2:35 PM  Clinical Narrative:    CSW following for disposition when medically appropriate. Plan per assessment on 2/10 is for pt to return to [facility] where she has been a resident for almost a year.    Expected Discharge Plan: [place] Barriers to Discharge: Continued Medical Work up  Expected Discharge Plan and Services Expected Discharge Plan: [place] In-house Referral: Clinical Social Work Discharge Planning Services: CM Consult Post Acute Care Choice: Resumption of Svcs/PTA Provider Living arrangements for the past 2 months: [place]  Readmission Risk Interventions Readmission Risk Prevention Plan 05/08/2019  Transportation Screening Complete  PCP or Specialist Appt within 5-7 Days Not Complete  Not Complete comments SNF resident  Home Care Screening Not Complete  Home Care Screening Not Completed Comments SNF resident  Medication Review (RN CM) Referral to Pharmacy  Some recent data might be hidden

## 2019-05-10 DIAGNOSIS — Z981 Arthrodesis status: Secondary | ICD-10-CM

## 2019-05-10 DIAGNOSIS — F17210 Nicotine dependence, cigarettes, uncomplicated: Secondary | ICD-10-CM

## 2019-05-10 DIAGNOSIS — M869 Osteomyelitis, unspecified: Secondary | ICD-10-CM

## 2019-05-10 DIAGNOSIS — Z515 Encounter for palliative care: Secondary | ICD-10-CM

## 2019-05-10 DIAGNOSIS — F1010 Alcohol abuse, uncomplicated: Secondary | ICD-10-CM

## 2019-05-10 DIAGNOSIS — Z7189 Other specified counseling: Secondary | ICD-10-CM

## 2019-05-10 DIAGNOSIS — R627 Adult failure to thrive: Secondary | ICD-10-CM

## 2019-05-10 DIAGNOSIS — Z9889 Other specified postprocedural states: Secondary | ICD-10-CM

## 2019-05-10 DIAGNOSIS — Z881 Allergy status to other antibiotic agents status: Secondary | ICD-10-CM

## 2019-05-10 DIAGNOSIS — D649 Anemia, unspecified: Secondary | ICD-10-CM

## 2019-05-10 LAB — BASIC METABOLIC PANEL
Anion gap: 10 (ref 5–15)
BUN: 14 mg/dL (ref 8–23)
CO2: 26 mmol/L (ref 22–32)
Calcium: 9.3 mg/dL (ref 8.9–10.3)
Chloride: 99 mmol/L (ref 98–111)
Creatinine, Ser: 0.53 mg/dL (ref 0.44–1.00)
GFR calc Af Amer: >60 mL/min (ref >60)
GFR calc non Af Amer: >60 mL/min (ref >60)
Glucose, Bld: 85 mg/dL (ref 70–99)
Potassium: 4 mmol/L (ref 3.5–5.1)
Sodium: 135 mmol/L (ref 135–145)

## 2019-05-10 LAB — CBC
HCT: 26.8 % — ABNORMAL LOW (ref 36.0–46.0)
Hemoglobin: 8.6 g/dL — ABNORMAL LOW (ref 12.0–15.0)
MCH: 26.2 pg (ref 26.0–34.0)
MCHC: 32.1 g/dL (ref 30.0–36.0)
MCV: 81.7 fL (ref 80.0–100.0)
Platelets: 815 10*3/uL — ABNORMAL HIGH (ref 150–400)
RBC: 3.28 MIL/uL — ABNORMAL LOW (ref 3.87–5.11)
RDW: 18.5 % — ABNORMAL HIGH (ref 11.5–15.5)
WBC: 8.9 10*3/uL (ref 4.0–10.5)
nRBC: 0 % (ref 0.0–0.2)

## 2019-05-10 LAB — TYPE AND SCREEN
ABO/RH(D): O POS
Antibody Screen: NEGATIVE
Unit division: 0

## 2019-05-10 LAB — BPAM RBC
Blood Product Expiration Date: 202103112359
ISSUE DATE / TIME: 202102111713
Unit Type and Rh: 5100

## 2019-05-10 MED ORDER — COLLAGENASE 250 UNIT/GM EX OINT: TOPICAL_OINTMENT | Freq: Every day | CUTANEOUS | Status: AC

## 2019-05-10 NOTE — Social Work (Signed)
Clinical Social Worker facilitated patient discharge including contacting patient family and facility to confirm patient discharge plans.  Clinical information faxed to facility and family agreeable with plan.  CSW arranged ambulance transport via [facility] to [facility]. RN to call [phone] with report prior to discharge.  Clinical Social Worker will sign off for now as social work intervention is no longer needed. Please consult us again if new need arises.  Emy Angevine, MSW, LCSW Clinical Social Worker   

## 2019-05-10 NOTE — Consult Note (Signed)
[place] for Infectious Disease    Date of Admission:  05/07/2019     Total days of antibiotics 3               Reason for Consult: Osteomyelitis    Referring Provider: [place] Primary Care Provider: [name], MD   ASSESSMENT:  Ms. Bayona is a 74 year old female with multiple pressure wounds and recent treatment for abscess and wound infection with 8 weeks of ampicillin and ciprofloxacin (750 mg) for polymicrobial infection with Enterococcus faecalis, Proteus mirabilis, E. Coli and Morganella. MRI imaging with mild osteomyelitis. She does not appear to have any significant infection in her wounds at present and completed a substantial course of antibiotic therapy. Recommend monitoring off antibiotics at this time and consider restarting therapy if wound worsens or signs of infection appear. Emphasized importance of off loading the site. Nutrition is likely to present a large barrier to healing evidenced by her severe malnutrition and underweight status. Continue wound care per General Surgery and [place] RN recommendations.   PLAN:  1. Discontinue vancomycin and piperacillin-tazobactam. Monitor off therapy for now. Consider restarting for worsening. 2. Off load site at least every 2 hours 3. Optimize nutrition per RD recommendations. 4. Continue wound care per General Surgery and [place] RN recommendations.  5. Recommend follow up with [facility] ID as needed.  ID will sign off and be available as needed.   Principal Problem:   Osteomyelitis ([place]) Active Problems:   Lumbar stenosis   Cervical stenosis of spine   Demyelinating disease of central nervous system ([place])   Sacral decubitus ulcer, stage IV ([place])   Severe protein-calorie malnutrition Altamease Oiler: less than 60% of standard weight) ([place])   DNR (do not resuscitate) present on admission   . sodium chloride   Intravenous Once  . vitamin C  500 mg Oral TID  . calcium carbonate  500 mg Oral Daily  . Chlorhexidine  Gluconate Cloth  6 each Topical Daily  . cholecalciferol  1,000 Units Oral Daily  . collagenase   Topical Daily  . enoxaparin (LOVENOX) injection  40 mg Subcutaneous Q24H  . feeding supplement (ENSURE ENLIVE)  237 mL Oral TID BM  . feeding supplement (PRO-STAT SUGAR FREE 64)  30 mL Oral TID WC  . folic acid  1 mg Oral Daily  . iron polysaccharides  150 mg Oral BID  . mirtazapine  30 mg Oral QHS  . multivitamin with minerals  1 tablet Oral Daily  . senna-docusate  2 tablet Oral BID  . thiamine  100 mg Oral Daily  . vitamin B-12  1,000 mcg Oral Daily     HPI: Mary Frazier is a 74 y.o. female with previous medical history significant for demyelinating disorder, transfusion, dependent  Chronic anemia, alcohol abuse, and chronic sacral wound complicated by calorie protein malnutrition admitted from [facility] for anemia with hemoglobin of 6.8   Ms. [name] was admitted to [facility] in August 2020 with bilateral lower extremity weakness with MRI revealing severe spinal stenosis of the cervical and lumbar spine. She underwent L2-L5 laminectomy and fusion with autogradt and allograft (no hardware) on 11/13/18 and C3-C7 laminectomy and instrumentation on 11/20/18. Discharged to [facility] and returned for follow up on 11/29/18 with increasing weakness and found to have large fluid collection in posterior lumber tissue (8.2 x 4.9 x 9.3 cm) and returned to the OR for evacuation of CSF collection and found to have  CSF leak. Started having dark drainage on 9/7 and placed on cefazolin and subsequently developed a fever with antibiotics changed to vancomycin and cefepime.  Returned for I&D on 9/8 and found to have purulence throughout down to the dura. Cultures were positive for Proteus mirabilis, Enterococcus faecalis, E. Coli and Morganella morganii. ID recommended treated with Ampicillin and Ciprofloxacin 750 for 8 weeks thru 11/3. On 99991111 course was complicated by sacral decubitus  ulcer and went for I&D. Cultures Bacteroides fragilis beta lactamase positive so metronidazole was added. She subsequently developed a rash and was changed to vancomycin which was discontinued on 1/21.  Ms. [name] had a temperature of 100.3 in the ED and was tachycardic and tachypneic. MRI sacrum with large sacral decubitus ulcer extending to the cortex with mild sacrococcygeal edema concerning for mild osteomyelitis; T2 hyperintense signal with right gluteus maximus muscle and mild muscle edema in the abductor muscles bilaterally possibly related to mild anasarca versus mild myositis; and interdiscal fluid at L3-L4 and L4-L5 possibly secondary to degenerative changes versus less likely discitis.  Ms. [name] has been afebrile since admission with no leukocytosis.  She was initially started on vancomycin and piperacillin-tazobactam.  General surgery evaluated and noted to have good granulation tissue with no signs of infection or necrosis with recommendation for continued antibiotics/wound care.  Wound ostomy RN evaluation with multiple wounds please see note dated 01/05/2020 for details.  ID has been consulted to make antibiotic recommendations if indicated.  Ms. [name] indicates she has been feeling well overall.  She is from [facility]. She says they turn her but not necessarily every 2 hours. Nutrition has been a problem as she does not tend to eat much. Nutrition is supplemented with Ensure.   Review of Systems: Review of Systems  Constitutional: Negative for chills, fever and weight loss.  Respiratory: Negative for cough, shortness of breath and wheezing.   Cardiovascular: Negative for chest pain and leg swelling.  Gastrointestinal: Negative for abdominal pain, constipation, diarrhea, nausea and vomiting.  Skin: Negative for rash.       Positive for multiple wounds     Past Medical History:  Diagnosis Date  . Alcohol abuse   . Demyelinating disorder ([place])   . Tobacco abuse      Social History   Tobacco Use  . Smoking status: Current Every Day Smoker    Packs/day: 0.50    Types: Cigarettes  . Smokeless tobacco: Never Used  Substance Use Topics  . Alcohol use: Never  . Drug use: Never    No family history on file.  Allergies  Allergen Reactions  . Ampicillin Rash    Severe rash with eosinophilia 01/2019. [place] ID notes    OBJECTIVE: Blood pressure 102/63, pulse 95, temperature 98.8 F (37.1 C), temperature source Oral, resp. rate 16, height 5\' 7"  (1.702 m), SpO2 98 %.  Physical Exam Constitutional:      General: She is not in acute distress.    Appearance: She is well-developed and underweight. She is ill-appearing.     Comments: Lying in bed with head of bed elevated; pleasant.   Cardiovascular:     Rate and Rhythm: Normal rate and regular rhythm.     Heart sounds: Normal heart sounds.  Pulmonary:     Effort: Pulmonary effort is normal.     Breath sounds: Normal breath sounds.  Skin:    General: Skin is warm and dry.  Neurological:     Mental Status: She is alert and  oriented to person, place, and time.  Psychiatric:        Behavior: Behavior normal.        Thought Content: Thought content normal.        Judgment: Judgment normal.     Lab Results Lab Results  Component Value Date   WBC 8.9 05/10/2019   HGB 8.6 (L) 05/10/2019   HCT 26.8 (L) 05/10/2019   MCV 81.7 05/10/2019   PLT 815 (H) 05/10/2019    Lab Results  Component Value Date   CREATININE 0.53 05/10/2019   BUN 14 05/10/2019   NA 135 05/10/2019   K 4.0 05/10/2019   CL 99 05/10/2019   CO2 26 05/10/2019    Lab Results  Component Value Date   ALT 10 05/07/2019   AST 15 05/07/2019   ALKPHOS 52 05/07/2019   BILITOT 0.4 05/07/2019     Microbiology: Recent Results (from the past 240 hour(s))  Culture, blood (routine x 2)     Status: None (Preliminary result)   Collection Time: 05/07/19  2:30 PM   Specimen: BLOOD LEFT ARM  Result Value Ref Range Status    Specimen Description BLOOD LEFT ARM  Final   Special Requests   Final    BOTTLES DRAWN AEROBIC AND ANAEROBIC Blood Culture adequate volume   Culture   Final    NO GROWTH 2 DAYS Performed at [facility], [place] [address] [place], [place] [address]    Report Status PENDING  Incomplete  Culture, blood (routine x 2)     Status: None (Preliminary result)   Collection Time: 05/07/19  2:35 PM   Specimen: BLOOD LEFT HAND  Result Value Ref Range Status   Specimen Description BLOOD LEFT HAND  Final   Special Requests   Final    BOTTLES DRAWN AEROBIC AND ANAEROBIC Blood Culture adequate volume   Culture   Final    NO GROWTH 2 DAYS Performed at [facility], [place] [address] [place], [place] [address]    Report Status PENDING  Incomplete  Urine culture     Status: Abnormal   Collection Time: 05/07/19  4:13 PM   Specimen: Urine, Random  Result Value Ref Range Status   Specimen Description URINE, RANDOM  Final   Special Requests   Final    NONE Performed at [facility], [place] [address], [place],  [address]    Culture MULTIPLE SPECIES PRESENT, SUGGEST RECOLLECTION (A)  Final   Report Status 05/08/2019 FINAL  Final  SARS CORONAVIRUS 2 (TAT 6-24 HRS) Nasopharyngeal Nasopharyngeal Swab     Status: None   Collection Time: 05/09/19  4:55 PM   Specimen: Nasopharyngeal Swab  Result Value Ref Range Status   SARS Coronavirus 2 NEGATIVE NEGATIVE Final    Comment: (NOTE) SARS-CoV-2 target nucleic acids are NOT DETECTED. The SARS-CoV-2 RNA is generally detectable in upper and lower respiratory specimens during the acute phase of infection. Negative results do not preclude SARS-CoV-2 infection, do not rule out co-infections with other pathogens, and should not be used as the sole basis for treatment or other patient management decisions. Negative results must be combined with clinical observations, patient history, and epidemiological information. The expected result is  Negative. Fact Sheet for Patients: SugarRoll.be Fact Sheet for Healthcare Providers: https://www.woods-mathews.com/ This test is not yet approved or cleared by the [place] FDA and  has been authorized for detection and/or diagnosis of SARS-CoV-2 by FDA under an Emergency Use Authorization (EUA). This  EUA will remain  in effect (meaning this test can be used) for the duration of the COVID-19 declaration under Section 56 4(b)(1) of the Act, 21 U.S.C. section 360bbb-3(b)(1), unless the authorization is terminated or revoked sooner. Performed at [facility], [place] [address] [place], [place] [address]      Terri Piedra, [place] [facility] [phone] Pager  05/10/2019  8:27 AM

## 2019-05-10 NOTE — TOC Transition Note (Signed)
Transition of Care [facility]) - CM/SW Discharge Note   Patient Details  Name: Mary Frazier MRN: [id number] Date of Birth: [birth year]  Transition of Care [facility]) CM/SW Contact:  Alexander [place], LCSW Phone Number: 05/10/2019, 1:56 PM   Clinical Narrative:    CSW has arranged [facility] transport, paperwork complete. RN aware, pt needs to go with signed DNR form. SNF ready for re-admission.   Final next level of care: [place] Barriers to Discharge: Barriers Resolved   Patient Goals and CMS Choice Patient states their goals for this hospitalization and ongoing recovery are:: Get her limbs working [employer].gov Compare Post Acute Care list provided to:: (LTC SNF resident) Choice offered to / list presented to : Patient  Discharge Placement   Existing PASRR number confirmed : 05/08/19          Patient [facility] bed at: [facility] Patient to be transferred to facility by: [facility] Name of family member notified: pt declines family notification at this time Patient and family notified of of transfer: 05/10/19  Discharge Plan and Services In-house Referral: Clinical Social Work Discharge Planning Services: CM Consult Post Acute Care Choice: Resumption of Svcs/PTA Provider            Readmission Risk Interventions Readmission Risk Prevention Plan 05/08/2019  Transportation Screening Complete  PCP or Specialist Appt within 5-7 Days Not Complete  Not Complete comments SNF resident  Home Care Screening Not Complete  Home Care Screening Not Completed Comments SNF resident  Medication Review (RN CM) Referral to Pharmacy  Some recent data might be hidden

## 2019-05-10 NOTE — Progress Notes (Signed)
Patient discharged back  to [facility] facility, reported to [place].

## 2019-05-10 NOTE — Consult Note (Signed)
Consultation Note Date: 05/10/2019   Patient Name: Mary Frazier  DOB: 04-May-1945  MRN: [id number]  Age / Sex: 74 y.o., female  PCP: Mary Duos, MD Referring Physician: Modena Jansky, MD  Reason for Consultation: Establishing goals of care  HPI/Patient Profile: 74 y.o. female  with past medical history of demyelinating disorder, transfusion dependent chronic anemia, malnutrition, multiple wounds/large sacral ulcer, lumbar laminectomy, cervical laminectomy with postop infection, depression, and bed bound status x 1 year admitted on 05/07/2019 with low hemoglobin and need for MRI to follow up sacral wound. Imaging suggested possible osteomyelitis and myositis. Per surgery, no need for surgical intervention.  PMT consulted for Mary Frazier.  Clinical Assessment and Goals of Care: I have reviewed medical records including EPIC notes, labs and imaging, received report from RN and Dr. Algis Frazier, assessed the patient and then met with patient  to discuss diagnosis prognosis, GOC, EOL wishes, disposition and options.  I offered to include other family members in meeting and patient declined. She tells me the person she is closest to is her aunt - Mary Frazier - however, she is in her 30s and her husband passed away and Ms. Mary Frazier does not want to "bother her". She also tells me she has a daughter however she speaks of some cognitive impairment/not dependable and therefore does not want her involved in medical decision making.   I first assessed Ms. Mary Frazier mental status as there were some reports of confusion (said she lived at home and not a facility - this was clarified telling me that she still has a home and she believes her relatives live there) - she is able to tell me where she is and why she is in the hospital. She knows she has been living at [facility] for a year and tells me it is close to [place]. She discusses her medical conditions and treatment options  appropriately.   I introduced Palliative Medicine as specialized medical care for people living with serious illness. It focuses on providing relief from the symptoms and stress of a serious illness. The goal is to improve quality of life for both the patient and the family.  We discussed a brief life review of the patient. She tells me she has been living at [facility] for a year - she likes it there and feels they care for her very well. She has 1 daughter as mentioned above.   As far as functional and nutritional status, she has been bed bound x1 year. She depends on staff to meet daily care needs. She does not have a good appetite - explains she has no desire to eat but does try to take in as much as she can.    We discussed her current illness and what it means in the larger context of her on-going co-morbidities.  Natural disease trajectory and expectations at EOL were discussed. We discussed multiple conditions all affecting each other. We discussed poor wound healing due to nutrition. Discussed failure to thrive, frailty, and debility. Discussed she is likely nearing end of life due to her multiple medical conditions - she is not surprised by this information and tells me she has been thinking about this for several weeks. Discussed concern about repeated hospitalizations.   I attempted to elicit values and goals of care important to the patient.  She tells me she would love to be able to walk or at least get in a wheelchair but she understands this is not feasible right now and  likely will not be.   The difference between aggressive medical intervention and comfort care was considered in light of the patient's goals of care. Ms. Mary Frazier endorses a desire to focus on her comfort and avoid aggressive medical interventions. She would not like to return to the hospital unless she cannot be made comfortable at [facility].   Advance directives, concepts specific to code status, artifical feeding and  hydration, and rehospitalization were considered and discussed. We completed a MOST form in which I detailed each option to her and she made the following selections: DNR, do not hospitalize, determine use of antibiotics when infection occurs, no IV fluids, no feeding tube.   Hospice and Palliative Care services outpatient were explained and offered. She is interested in the extra support of hospice at [facility].   We discussed completing HCPOA paperwork - Ms. Mary Frazier would like her Aunt Mary Frazier to be her HCPOA if she were unable to speak for herself. Ms. Mary Frazier declines to complete paperwork at this time because she would like to discuss it with her daughter first. I offered to call both daughter and aunt; however, she declined. She tells me her daughter is very hard to get in touch with.   Questions and concerns were addressed. The patient was encouraged to call with questions or concerns.   Primary Decision Maker PATIENT  If she were unable to make decisions she would want her aunt, Mary Frazier, to make decisions for her [phone]  SUMMARY OF RECOMMENDATIONS   - back to [facility] with hospice - MOST completed: DNR, no hospitalization unless comfort cannot be met at facility, determine use of antibiotics when infection occurs, no iv fluids, no feeding tube  Code Status/Advance Care Planning:  DNR   Symptom Management:   Denies symptoms  Requests ensure - ordered   Prognosis:   < 6 months  Discharge Planning: [place] with Hospice      Primary Diagnoses: Present on Admission: . Osteomyelitis ([place]) . Lumbar stenosis . Cervical stenosis of spine . Demyelinating disease of central nervous system ([place]) . Sacral decubitus ulcer, stage IV ([place]) . Severe protein-calorie malnutrition Mary Frazier: less than 60% of standard weight) ([place])   I have reviewed the medical record, interviewed the patient and family, and examined the patient. The following aspects are  pertinent.  Past Medical History:  Diagnosis Date  . Alcohol abuse   . Demyelinating disorder ([place])   . Tobacco abuse    Social History   Socioeconomic History  . Marital status: Single    Spouse name: Not on file  . Number of children: Not on file  . Years of education: Not on file  . Highest education level: Not on file  Occupational History  . Not on file  Tobacco Use  . Smoking status: Current Every Day Smoker    Packs/day: 0.50    Types: Cigarettes  . Smokeless tobacco: Never Used  Substance and Sexual Activity  . Alcohol use: Never  . Drug use: Never  . Sexual activity: Not Currently  Other Topics Concern  . Not on file  Social History Narrative   Single.  Tobacco abuse - current every day smoker. Alcohol abuse.    Social Determinants of Health   Financial Resource Strain:   . Difficulty of Paying Living Expenses: Not on file  Food Insecurity:   . Worried About Charity fundraiser in the Last Year: Not on file  . Ran Out of Food in the  Last Year: Not on file  Transportation Needs:   . Lack of Transportation (Medical): Not on file  . Lack of Transportation (Non-Medical): Not on file  Physical Activity:   . Days of Exercise per Week: Not on file  . Minutes of Exercise per Session: Not on file  Stress:   . Feeling of Stress : Not on file  Social Connections:   . Frequency of Communication with Friends and Family: Not on file  . Frequency of Social Gatherings with Friends and Family: Not on file  . Attends Religious Services: Not on file  . Active Member of Clubs or Organizations: Not on file  . Attends Archivist Meetings: Not on file  . Marital Status: Not on file   No family history on file. Scheduled Meds: . sodium chloride   Intravenous Once  . vitamin C  500 mg Oral TID  . calcium carbonate  500 mg Oral Daily  . Chlorhexidine Gluconate Cloth  6 each Topical Daily  . cholecalciferol  1,000 Units Oral Daily  . collagenase   Topical Daily    . enoxaparin (LOVENOX) injection  40 mg Subcutaneous Q24H  . feeding supplement (ENSURE ENLIVE)  237 mL Oral TID BM  . feeding supplement (PRO-STAT SUGAR FREE 64)  30 mL Oral TID WC  . folic acid  1 mg Oral Daily  . iron polysaccharides  150 mg Oral BID  . mirtazapine  30 mg Oral QHS  . multivitamin with minerals  1 tablet Oral Daily  . senna-docusate  2 tablet Oral BID  . thiamine  100 mg Oral Daily  . vitamin B-12  1,000 mcg Oral Daily   Continuous Infusions: PRN Meds:.acetaminophen, bisacodyl, magnesium hydroxide, ondansetron **OR** ondansetron (ZOFRAN) IV, polyethylene glycol Allergies  Allergen Reactions  . Ampicillin Rash    Severe rash with eosinophilia 01/2019. [facility] ID notes   Review of Systems  Constitutional: Positive for appetite change.  Skin: Positive for wound.  Neurological: Positive for weakness.  All other systems reviewed and are negative.   Physical Exam Constitutional:      Comments: cachectic  HENT:     Head: Normocephalic and atraumatic.  Cardiovascular:     Rate and Rhythm: Normal rate.  Pulmonary:     Effort: Pulmonary effort is normal.     Breath sounds: Normal breath sounds.  Skin:    General: Skin is warm and dry.  Neurological:     Mental Status: She is alert and oriented to person, place, and time.  Psychiatric:        Mood and Affect: Mood normal.        Behavior: Behavior normal.        Thought Content: Thought content normal.        Judgment: Judgment normal.     Vital Signs: BP 102/63 (BP Location: Right Arm)   Pulse 95   Temp 98.8 F (37.1 C) (Oral)   Resp 16   Ht 5' 7"  (1.702 m)   SpO2 98%   BMI 16.23 kg/m  Pain Scale: 0-10   Pain Score: 0-No pain   SpO2: SpO2: 98 % O2 Device:SpO2: 98 % O2 Flow Rate: .   IO: Intake/output summary:   Intake/Output Summary (Last 24 hours) at 05/10/2019 1106 Last data filed at 05/10/2019 0900 Gross per 24 hour  Intake 647 ml  Output 300 ml  Net 347 ml    LBM: Last BM Date:  05/09/19 Baseline Weight:   Most recent weight:  Palliative Assessment/Data: PPS 30%     Time In: 0930 Time Out: 1115 Time Total: 105 minutes Greater than 50%  of this time was spent counseling and coordinating care related to the above assessment and plan.  Juel Burrow, DNP, AGNP-C Palliative Medicine Team [phone] Pager: [phone]

## 2019-05-10 NOTE — Discharge Instructions (Signed)

## 2019-05-10 NOTE — Discharge Summary (Signed)
Physician Discharge Summary  Mary Frazier KVQ:[id number] DOB: November 05, 1945  PCP: [name], MD  Admitted from: SNF Discharged to: SNF with hospice follow-up.  Admit date: 05/07/2019 Discharge date: 05/10/2019  Recommendations for Outpatient Follow-up:   Follow-up Information    MD at SNF. Schedule an appointment as soon as possible for a visit.   Why: To be seen in 2 to 3 days.  Defer lab work to MD depending on how aggressive patient wishes to be.  Continue hospice consultation and care at SNF.       [name], MD. Schedule an appointment as soon as possible for a visit.   Specialty: Internal Medicine Contact information: [place] [id number] [phone]            Home Health: N/A Equipment/Devices: TBD at SNF.  Patient will return to SNF with indwelling Foley catheter that she had prior to hospital admission.  Recommend air overlay mattress and change of position every 2 hours to assist with healing of sacral decubitus ulcer.  Discharge Condition: Improved and stable but overall prognosis guarded. CODE STATUS: DNR. Diet recommendation: Regular diet.  Discharge Diagnoses:  Principal Problem:   Osteomyelitis ([place]) Active Problems:   Lumbar stenosis   Cervical stenosis of spine   Demyelinating disease of central nervous system ([place])   Sacral decubitus ulcer, stage IV (HCC)   Severe protein-calorie malnutrition Altame[place] Oiler: less than 60% of standard weight) ([place])   DNR (do not resuscitate) present on admission   Failure to thrive in adult   Goals of care, counseling/discussion   Palliative care by specialist   Brief Summary: 74 year old female, resident of [facility] from SNF for more than a year , reportedly nonambulatory for more than a year, mostly bedbound, PMH of demyelinating disorder, transfusion dependent chronic anemia, protein calorie malnutrition, multiple pressure wounds and recent treatment for abscess and wound infection with 8  weeks of ampicillin and ciprofloxacin for polymicrobial infection with Enterococcus faecalis, Proteus mirabilis, E. coli and Morganella, complex spine surgery history as per ID as outlined below, seen by PCP on day prior to admission, noted to have low hemoglobin of 6.8 and referred to ED for hospital admission for blood transfusion as well as sacral MRI which was already prescheduled.  Imaging suggested possible osteomyelitis and myositis.  General surgery consulted and did not see any surgical needs.  PMT and ID were consulted.  Additional past history as copied from infectious disease provider's note from today:  "[name] was admitted to [facility] in August 2020 with bilateral lower extremity weakness with MRI revealing severe spinal stenosis of the cervical and lumbar spine. She underwent L2-L5 laminectomy and fusion with autogradt and allograft (no hardware) on 11/13/18 and C3-C7 laminectomy and instrumentation on 11/20/18. Discharged to [facility] and returned for follow up on 11/29/18 with increasing weakness and found to have large fluid collection in posterior lumber tissue (8.2 x 4.9 x 9.3 cm) and returned to the OR for evacuation of CSF collection and found to have CSF leak. Started having dark drainage on 9/7 and placed on cefazolin and subsequently developed a fever with antibiotics changed to vancomycin and cefepime.  Returned for I&D on 9/8 and found to have purulence throughout down to the dura. Cultures were positive for Proteus mirabilis, Enterococcus faecalis, E. Coli and Morganella morganii. ID recommended treated with Ampicillin and Ciprofloxacin 750 for 8 weeks thru 11/3. On 6/01 course was complicated by sacral decubitus ulcer and went for  I&D. Cultures Bacteroides fragilis beta lactamase positive so metronidazole was added. She subsequently developed a rash and was changed to vancomycin which was discontinued on 1/21."   Assessment & Plan:    Stage IV sacral  decubitus ulcer: Present on admission.  MRI findings as indicated below.  General surgery consulted on 2/9 and did not feel that there were any signs of infection or necrosis and recommended continued antibiotics, WOC RN consultation without need for surgical debridement at this time.  [place] RN consult appreciated, largest stage IV sacral wound not overtly infected and patient has multiple other wounds as listed in her note from 2/10.    Large sacral decubitus does not appear overtly infected or draining.  Please see picture below.  I do not think that she can tolerate any kind of surgical interventions.  She has received a couple days of IV vancomycin and Zosyn in the hospital.  ID consultation from today appreciated, they do not feel that she has any significant infection in her wounds at present and recommend discontinuing all antibiotics and monitoring off of antibiotics at this time and consider restarting antibiotics if there is any features of infection in the future.  It is important to offload the pressure site as well as improve her nutritional status if she has to have any chance of improvement.  Please refer to wound care consult note from 2/10 regarding details of all her wounds and management.  Adult failure to thrive: Multifactorial due to advanced age, severe protein calorie malnutrition, nonambulatory, multiple wounds on body.  Overall poor prognosis.    I met with PMT provider at bedside, their input much appreciated, patient agreeable to returning to [facility] with hospice, DNR in place, MOST form completed and they have confirmed with patient regarding no hospitalization unless comfort cannot be met at facility, determine use of antibiotics when infection occurs, no IV fluids and no feeding tube.  Severe protein calorie malnutrition: Encourage oral intake/regular diet.  RD consulted.  Depression: Continue mirtazapine which should help her appetite as well.  Anemia of chronic  disease: Hemoglobin down to 7.1.  Anemia panel appreciated.  Ferritin 608 may be an acute phase reactant.  Asymptomatic.  Her hemoglobin has mostly been in the 7 g range over the last month and has gradually drifted down.  Hemoglobin 7 g yesterday, improved to 8.6 g after 1 unit PRBCs.  Continue iron and folate supplements and other nutritional supplements.  Thrombocytosis, possibly reactive versus due to iron deficiency anemia.  Demyelinating disorder, nonambulatory with contractures  Body mass index is 16.23 kg/m.  Nutritional Status Nutrition Problem: Severe Malnutrition Etiology: chronic illness(demyelinating disorder) Signs/Symptoms: severe muscle depletion, severe fat depletion, energy intake < 75% for > or equal to 1 month Interventions: Ensure Enlive (each supplement provides 350kcal and 20 grams of protein), MVI, Prostat  Chronic indwelling Foley catheter: Unclear if this is for urinary retention from her neurological issues or to avoid soiling of stage IV sacral decubitus ulcer.  This was present PTA.  Although patient has significant bacteriuria and pyuria, no clinical UTI.   Consultants:   General surgery PMT ID  Procedures:   Continue pre-existing Foley catheter at discharge.   Discharge Instructions  Discharge Instructions    Call MD for:  difficulty breathing, headache or visual disturbances   Complete by: As directed    Call MD for:  extreme fatigue   Complete by: As directed    Call MD for:  persistant dizziness or light-headedness  Complete by: As directed    Call MD for:  persistant nausea and vomiting   Complete by: As directed    Call MD for:  redness, tenderness, or signs of infection (pain, swelling, redness, odor or green/yellow discharge around incision site)   Complete by: As directed    Call MD for:  severe uncontrolled pain   Complete by: As directed    Call MD for:  temperature >100.4   Complete by: As directed    Diet general    Complete by: As directed    Discharge instructions   Complete by: As directed    Patient will return to SNF with Foley catheter that she had prior to hospital admission.   Discharge wound care:   Complete by: As directed    As per [facility] RN consultation in the hospital as below:  [place] Nurse Consult Note: Reason for Consult: Consult requested for multiple wounds.  Surgical team assessed sacrum wound, which has osteomyelitis, yesterday and did not feel it needed debridement, and referred [place] nurses for topical treatment recommendations. Pt is very emaciated and immobile with multiple systemic factors which can impair healing.  Wound type: Sacrum with chronic stage 4 pressure injury; 10X10X1cm with 3 cm undermining.  Wound red red and moist, bone palpable, mod amt tan drainage, no odor.   Left buttock near sacrum wound with 1X1cm darker-colored skin; deep tissue pressure injury. Left hip with unstageable pressure injury; 2X2.5cm, loose slough/eschar, small amt tan drainage, no odor Left knee with stage 2 pressure injury: 1X1X.1cm, pink and dry Left outer foot with full thickness wound; 1X1cm, 100% eschar/slough, small amt tan drainage, no odor Left inner toe 1X1.5cm dry full thickness callous, dark brown with small amt drainage Left ankle with darker colored deep tissue injury; .5X.5cm Left heel 1.5X1.5X.1cm stage 2 pressure injury Right outer knee with unstageable pressure injury; 1X.5cm 100% eschar, small amt tan drainage Right knee with partial thickness abrasion; 3X1X.1cm, pink and dry Right hip stage 2 3X.5X.1cm, pink and dry Right heel 3X2cm unstageable pressure injury, 100% eschar; it is best practice to leave dry stable eschar in place Right inner ankle with stage 4 pressure injury; 4X3X.3cm, red and moist, mod amt tan drainage, bone palpable Right outer foot with 2 areas of unstageable pressure injuries; moist eschar 5X3cm and 3X2cm Pressure Injury POA: Yes Dressing  procedure/placement/frequency: Topical treatment orders provided for bedside nurses to perform daily as follows:  Aquacel to sacrum and right ankle wounds to absorb drainage and provide antimicrobial benefits.  Air mattress to reduce pressure. Prevalon boots to reduce pressure to BLE.  Foam dressings to protect BLE wounds from further injury. Santyl to provide enzymati   Increase activity slowly   Complete by: As directed        Medication List    TAKE these medications   acetaminophen 500 MG tablet Commonly known as: TYLENOL Take 1,000 mg by mouth every 8 (eight) hours as needed for mild pain or headache (CANNOT EXCEED 3,000 MG TYLENOL/24 HOURS FROM ALL COMBINED SOURCES).   bisacodyl 10 MG suppository Commonly known as: DULCOLAX Place 10 mg rectally daily as needed for mild constipation or moderate constipation (NOT RELIEVED BY MILK OF MAGNESIA).   CALCIUM 500 PO Take 500 mg by mouth daily.   collagenase ointment Commonly known as: SANTYL Apply topically daily. Apply Santyl to left hip, right outer knee, right outer foot in 2 locations, and right inner plantar foot Q day, then cover with moist gauze and foam  dressings.  (Change foam dressings Q 3 days or PRN soiling.) Start taking on: May 11, 2019   D3-1000 25 MCG (1000 UT) capsule Generic drug: Cholecalciferol Take 1,000 Units by mouth daily.   ENSURE ENLIVE PO Take 237 mLs by mouth 3 (three) times daily.   NUTRITIONAL SUPPLEMENT PO Take 1 each by mouth See admin instructions. Magic Cup: Eat one serving 2 times a day- with lunch and dinner   feeding supplement (PRO-STAT SUGAR FREE 64) Liqd Take 30 mLs by mouth 2 (two) times daily.   folic acid 1 MG tablet Commonly known as: FOLVITE Take 1 mg by mouth daily.   magnesium hydroxide 400 MG/5ML suspension Commonly known as: MILK OF MAGNESIA Take 30 mLs by mouth daily as needed for mild constipation.   mirtazapine 30 MG tablet Commonly known as: REMERON Take 30 mg  by mouth at bedtime.   multivitamin with minerals tablet Take 1 tablet by mouth daily.   NON FORMULARY Diet order: Regular Diet.   polyethylene glycol 17 g packet Commonly known as: MIRALAX / GLYCOLAX Take 17 g by mouth 2 (two) times daily as needed for mild constipation (TO BE MIXED INTO 4 OUNCES OF WATER/EACH DOSE).   Polysaccharide-Iron Complex 150 MG Caps Take 150 mg by mouth daily.   Prevalon Misc See admin instructions. Prevalon boots- Apply to both feet daily, while in bed and may remove for AM and/or PM care   RA Saline Enema 19-7 GM/118ML Enem Place 1 enema rectally daily as needed (for constipation not relieved by Dulcolax suppository and call MD if no relief from enema).   senna-docusate 8.6-50 MG tablet Commonly known as: Senokot-S Take 2 tablets by mouth 2 (two) times daily.   thiamine 100 MG tablet Take 100 mg by mouth daily.   vitamin B-12 1000 MCG tablet Commonly known as: CYANOCOBALAMIN Take 1,000 mcg by mouth daily.   vitamin C 500 MG tablet Commonly known as: ASCORBIC ACID Take 500 mg by mouth 3 (three) times daily with meals.      Allergies  Allergen Reactions  . Ampicillin Rash    Severe rash with eosinophilia 01/2019. [facility] ID notes      Procedures/Studies: MR SACRUM SI JOINTS W WO CONTRAST  Result Date: 05/07/2019 CLINICAL DATA:  Nonhealing wound over the buttock. EXAM: MRI LUMBAR SPINE WITHOUT AND WITH CONTRAST TECHNIQUE: Multiplanar and multiecho pulse sequences of the lumbar spine were obtained without and with intravenous contrast. CONTRAST:  67m GADAVIST GADOBUTROL 1 MMOL/ML IV SOLN COMPARISON:  None. FINDINGS: Bones/Joint/Cartilage Large sacral decubitus ulcer extending to the cortex with mild sacrococcygeal edema concerning for mild osteomyelitis. No fracture or dislocation. Normal alignment. No joint effusion. Mild osteoarthritis of bilateral SI joints. Inter discal fluid at L3-4 and L4-5 which may be secondary to degenerative changes  versus less likely discitis. Degenerative disease with disc height loss throughout the lumbar spine. Broad-based disc osteophyte complex at L3-4, L4-5 and L5-S1 with bilateral facet arthropathy. Severe bilateral foraminal stenosis at L4-5 and L5-S1. Ligaments, Muscles and Tendons Generalized muscle atrophy. T2 hyperintense signal within the right gluteus maximus muscle as can be seen with myositis versus neurogenic edema. Mild muscle edema and perifascial edema in the adductor muscles bilaterally. Soft tissue Generalized soft tissue edema in the subcutaneous fat. More focal soft tissue edema and inflammatory changes overlying the posterior aspect of the left ilium likely reflecting more focal cellulitis. No drainable fluid collection or hematoma. 4 x 2 cm T1 and T2 hyperintense soft tissue mass inferior  to the right posterior hip joint likely reflecting a simple lipoma. IMPRESSION: 1. Large sacral decubitus ulcer extending to the cortex with mild sacrococcygeal edema concerning for mild osteomyelitis. 2. T2 hyperintense signal within the right gluteus maximus muscle. Mild muscle edema and perifascial edema in the adductor muscles bilaterally. This may related to mild anasarca versus mild myositis. 3. Inter discal fluid at L3-4 and L4-5 which may be secondary to degenerative changes versus less likely discitis. 4. Diffuse lumbar spine spondylosis. Electronically Signed   By: Kathreen Devoid   On: 05/07/2019 14:41   DG Chest Portable 1 View  Result Date: 05/07/2019 CLINICAL DATA:  Chest pain, short of breath, hypotensive, sepsis EXAM: PORTABLE CHEST 1 VIEW COMPARISON:  11/06/2018 FINDINGS: The heart size and mediastinal contours are within normal limits. Both lungs are clear. The visualized skeletal structures are unremarkable. IMPRESSION: No active disease. Electronically Signed   By: Randa Ngo M.D.   On: 05/07/2019 15:02      Subjective: Patient interviewed and examined along with PMT provider at bedside.   Denies complaints.  Denies pain.  She tells [place] that her daughter and son-in-law have been living in her house and [facility] has [facility].  Discharge Exam:  Vitals:   05/09/19 2002 05/09/19 2116 05/09/19 2118 05/10/19 0527  BP: (!) 95/58 95/64  102/63  Pulse: 100 (!) 103 100 95  Resp: 16 16  16   Temp: 99 F (37.2 C) 99.4 F (37.4 C)  98.8 F (37.1 C)  TempSrc: Oral Oral  Oral  SpO2: 100% 100%  98%  Height:        General exam: Elderly female, small built, frail, chronically ill looking lying comfortably propped up in bed without distress. Respiratory system: Clear to auscultation.  No increased work of breathing. Cardiovascular system: S1 & S2 heard, RRR. No JVD, murmurs, rubs, gallops or clicks. No pedal edema.   Gastrointestinal system: Abdomen is nondistended, soft and nontender. No organomegaly or masses felt. Normal bowel sounds heard. Central nervous system: Alert and oriented.  No cranial nerve deficits. Extremities: Symmetric 4 x 5 power in upper extremities: Lower extremities with contractures, mostly in the right lower extremity and grade 0 or 1 x 5 power in lower extremities. Skin: Wound exam as per WOC RN note from 2/10.  Along with patient's female RN, I examined her large sacral decubitus ulcer with findings as in picture below from 2/11.  No overt infection or drainage. Psychiatry: Judgement and insight appear normal. Mood & affect appropriate.       The results of significant diagnostics from this hospitalization (including imaging, microbiology, ancillary and laboratory) are listed below for reference.     Microbiology: Recent Results (from the past 240 hour(s))  Culture, blood (routine x 2)     Status: None (Preliminary result)   Collection Time: 05/07/19  2:30 PM   Specimen: BLOOD LEFT ARM  Result Value Ref Range Status   Specimen Description BLOOD LEFT ARM  Final   Special Requests   Final    BOTTLES DRAWN AEROBIC AND ANAEROBIC Blood Culture adequate volume    Culture   Final    NO GROWTH 3 DAYS Performed at [facility], [address] [place], [place] [address]    Report Status PENDING  Incomplete  Culture, blood (routine x 2)     Status: None (Preliminary result)   Collection Time: 05/07/19  2:35 PM   Specimen: BLOOD LEFT HAND  Result Value Ref Range Status  Specimen Description BLOOD LEFT HAND  Final   Special Requests   Final    BOTTLES DRAWN AEROBIC AND ANAEROBIC Blood Culture adequate volume   Culture   Final    NO GROWTH 3 DAYS Performed at [facility], [address] [place], [place] [address]    Report Status PENDING  Incomplete  Urine culture     Status: Abnormal   Collection Time: 05/07/19  4:13 PM   Specimen: Urine, Random  Result Value Ref Range Status   Specimen Description URINE, RANDOM  Final   Special Requests   Final    NONE Performed at [facility], [place] [address] [place], [place] [address]    Culture MULTIPLE SPECIES PRESENT, SUGGEST RECOLLECTION (A)  Final   Report Status 05/08/2019 FINAL  Final  SARS CORONAVIRUS 2 (TAT 6-24 HRS) Nasopharyngeal Nasopharyngeal Swab     Status: None   Collection Time: 05/09/19  4:55 PM   Specimen: Nasopharyngeal Swab  Result Value Ref Range Status   SARS Coronavirus 2 NEGATIVE NEGATIVE Final    Comment: (NOTE) SARS-CoV-2 target nucleic acids are NOT DETECTED. The SARS-CoV-2 RNA is generally detectable in upper and lower respiratory specimens during the acute phase of infection. Negative results do not preclude SARS-CoV-2 infection, do not rule out co-infections with other pathogens, and should not be used as the sole basis for treatment or other patient management decisions. Negative results must be combined with clinical observations, patient history, and epidemiological information. The expected result is Negative. Fact Sheet for Patients: SugarRoll.be Fact Sheet for Healthcare  Providers: https://www.woods-mathews.com/ This test is not yet approved or cleared by the [place] FDA and  has been authorized for detection and/or diagnosis of SARS-CoV-2 by FDA under an Emergency Use Authorization (EUA). This EUA will remain  in effect (meaning this test can be used) for the duration of the COVID-19 declaration under Section 56 4(b)(1) of the Act, 21 U.S.C. section 360bbb-3(b)(1), unless the authorization is terminated or revoked sooner. Performed at [facility], [place] [address], [place], [place] [address]      Labs: CBC: Recent Labs  Lab 05/06/19 0000 05/07/19 1430 05/08/19 0138 05/09/19 0238 05/10/19 0408  WBC 11.4 10.4 9.2 9.3 8.9  NEUTROABS 8 7.1  --   --   --   HGB 6.8* 7.4* 7.1* 7.0* 8.6*  HCT 21* 24.7* 23.2* 22.2* 26.8*  MCV  --  81.3 79.7* 77.6* 81.7  PLT 943* 1,028* 881* 868* 815*    Basic Metabolic Panel: Recent Labs  Lab 05/06/19 0000 05/07/19 1430 05/08/19 0138 05/10/19 0408  NA 134* 135 134* 135  K 5.0 4.3 4.5 4.0  CL 96* 97* 98 99  CO2 25* 26 26 26   GLUCOSE  --  102* 93 85  BUN 14 22 19 14   CREATININE 0.3* 0.42* 0.46 0.53  CALCIUM 9.0 9.5 9.3 9.3    Liver Function Tests: Recent Labs  Lab 05/06/19 0000 05/07/19 1430  AST 12* 15  ALT 5* 10  ALKPHOS 60 52  BILITOT  --  0.4  PROT  --  7.8  ALBUMIN 3.0* 2.4*     Anemia work up Recent Labs    05/08/19 0138  FERRITIN 608*  TIBC 168*  IRON 26*    Urinalysis    Component Value Date/Time   COLORURINE AMBER (A) 05/07/2019 1613   APPEARANCEUR CLOUDY (A) 05/07/2019 1613   LABSPEC 1.027 05/07/2019 1613   PHURINE 5.0 05/07/2019 1613  GLUCOSEU NEGATIVE 05/07/2019 1613   HGBUR NEGATIVE 05/07/2019 1613   BILIRUBINUR NEGATIVE 05/07/2019 1613   KETONESUR 5 (A) 05/07/2019 1613   PROTEINUR NEGATIVE 05/07/2019 1613   NITRITE NEGATIVE 05/07/2019 1613   LEUKOCYTESUR LARGE (A) 05/07/2019 1613      Time coordinating discharge: 40  minutes  SIGNED:  Vernell Leep, MD, FACP, [facility]. [facility]  To contact the attending provider between 7A-7P or the covering provider during after hours 7P-7A, please log into the web site [email or web address] and access using universal [place] password for that web site. If you do not have the password, please call the hospital operator.

## 2019-05-10 NOTE — TOC Progression Note (Signed)
[facility]) - Progression Note    Patient Details  Name: Mary Frazier MRN: [id number] Date of Birth: 1946/03/12  Transition of Care [facility]) CM/SW [place] Phone Number: 05/10/2019, 11:37 AM  Clinical Narrative:    Plan for pt to return to [facility]. Pt spoke with PMT and per consult and discussion with [place] she is open to hospice becoming involved in her care at [facility]. CSW reached out to [place] with [place] to make referral.   CSW await confirmation from Dr. Algis Liming about discharge today. PMT has completed gold DNR form.    Expected Discharge Plan: [place] Barriers to Discharge: Continued Medical Work up  Expected Discharge Plan and Services Expected Discharge Plan: [place] In-house Referral: Clinical Social Work Discharge Planning Services: CM Consult Post Acute Care Choice: Resumption of Svcs/PTA Provider Living arrangements for the past 2 months: [place]  Readmission Risk Interventions Readmission Risk Prevention Plan 05/08/2019  Transportation Screening Complete  PCP or Specialist Appt within 5-7 Days Not Complete  Not Complete comments SNF resident  Home Care Screening Not Complete  Home Care Screening Not Completed Comments SNF resident  Medication Review (RN CM) Referral to Pharmacy  Some recent data might be hidden

## 2019-05-11 DIAGNOSIS — M6281 Muscle weakness (generalized): Secondary | ICD-10-CM | POA: Diagnosis not present

## 2019-05-11 DIAGNOSIS — M4626 Osteomyelitis of vertebra, lumbar region: Secondary | ICD-10-CM | POA: Diagnosis not present

## 2019-05-11 DIAGNOSIS — R1312 Dysphagia, oropharyngeal phase: Secondary | ICD-10-CM | POA: Diagnosis not present

## 2019-05-12 LAB — CULTURE, BLOOD (ROUTINE X 2)
Culture: NO GROWTH
Culture: NO GROWTH
Special Requests: ADEQUATE
Special Requests: ADEQUATE

## 2019-05-13 ENCOUNTER — Non-Acute Institutional Stay (SKILLED_NURSING_FACILITY): Payer: PPO | Admitting: Internal Medicine

## 2019-05-13 ENCOUNTER — Encounter: Payer: Self-pay | Admitting: Internal Medicine

## 2019-05-13 DIAGNOSIS — F325 Major depressive disorder, single episode, in full remission: Secondary | ICD-10-CM | POA: Diagnosis not present

## 2019-05-13 DIAGNOSIS — M8630 Chronic multifocal osteomyelitis, unspecified site: Secondary | ICD-10-CM

## 2019-05-13 DIAGNOSIS — R627 Adult failure to thrive: Secondary | ICD-10-CM

## 2019-05-13 DIAGNOSIS — K5909 Other constipation: Secondary | ICD-10-CM | POA: Diagnosis not present

## 2019-05-13 DIAGNOSIS — R05 Cough: Secondary | ICD-10-CM | POA: Diagnosis not present

## 2019-05-13 DIAGNOSIS — D539 Nutritional anemia, unspecified: Secondary | ICD-10-CM

## 2019-05-13 DIAGNOSIS — L89154 Pressure ulcer of sacral region, stage 4: Secondary | ICD-10-CM

## 2019-05-13 DIAGNOSIS — E43 Unspecified severe protein-calorie malnutrition: Secondary | ICD-10-CM

## 2019-05-13 DIAGNOSIS — D649 Anemia, unspecified: Secondary | ICD-10-CM | POA: Diagnosis not present

## 2019-05-13 DIAGNOSIS — M2550 Pain in unspecified joint: Secondary | ICD-10-CM | POA: Diagnosis not present

## 2019-05-13 NOTE — Progress Notes (Signed)
: Provider:  Noah Delaine alexanderMD Location:  [place] Room Number: 425-W Place of Service:  SNF ([id number])  PCP: [name], MD Patient Care Team: [name], MD as PCP - General (Internal Medicine)  Extended Emergency Contact Information Primary Emergency Contact: Alycia Rossetti Mobile Phone: [phone] Relation: Aunt     Allergies: Ampicillin  Chief Complaint  Patient presents with  . Readmit To SNF    Readmission to [facility] SNF    HPI: Patient is a 74 y.o. female with history of demyelinating disorder, transfusion dependent chronic anemia, protein calorie malnutrition, multiple pressure wounds and recent treatment for abscess wound infection with 8 weeks of ampicillin and Cipro for polymicrobial infection with Enterococcus, Proteus, E. coli and Morganella, complex spine surgery history with postop infections and wound drainage procedures who presented to [facility] ED with a hemoglobin of 6.8, with referral to ED for blood transfusion as well as a sacral MRI which has already been prescheduled.  Patient was admitted to [facility] from 2/9[facility]12 where she was treated for anemia with 1 unit PRBC and a sacral decubitus ulcer stage IV which was present on admission with an MRI showing osteomyelitis.  Patient also has had failure to thrive with overall poor prognosis with discussions regarding being admitted back to [place] with hospice, DNR in place, and MOST form, wishing only use of antibiotics when infection occurs, no IV fluids and no feeding tube.  Patient is admitted back to [place] for supportive care and comfort care.  While at skilled nursing facility patient will be followed for depression treated with Remeron microcytic anemia treated with folic acid and [id number] and constipation Senokot-S  Past Medical History:  Diagnosis Date  . Alcohol abuse   . Demyelinating disorder ([place])   . Tobacco abuse     History reviewed. No  pertinent surgical history.  Allergies as of 05/13/2019      Reactions   Ampicillin Rash   Severe rash with eosinophilia 01/2019. [facility] ID notes      Medication List       Accurate as of May 13, 2019  1:32 PM. If you have any questions, ask your nurse or doctor.        acetaminophen 500 MG tablet Commonly known as: TYLENOL Take 1,000 mg by mouth every 8 (eight) hours as needed for mild pain or headache (CANNOT EXCEED 3,000 MG TYLENOL/24 HOURS FROM ALL COMBINED SOURCES).   bisacodyl 10 MG suppository Commonly known as: DULCOLAX Place 10 mg rectally daily as needed for mild constipation or moderate constipation (NOT RELIEVED BY MILK OF MAGNESIA).   CALCIUM 500 PO Take 500 mg by mouth daily.   collagenase ointment Commonly known as: SANTYL Apply topically daily. Apply Santyl to left hip, right outer knee, right outer foot in 2 locations, and right inner plantar foot Q day, then cover with moist gauze and foam dressings.  (Change foam dressings Q 3 days or PRN soiling.)   D3-1000 25 MCG (1000 UT) capsule Generic drug: Cholecalciferol Take 1,000 Units by mouth daily.   ENSURE ENLIVE PO Take 237 mLs by mouth 3 (three) times daily.   NUTRITIONAL SUPPLEMENT PO Take 1 each by mouth See admin instructions. Magic Cup: Eat one serving 2 times a day- with lunch and dinner   feeding supplement (PRO-STAT SUGAR FREE 64) Liqd Take 30 mLs by mouth 2 (two) times daily.   folic acid 1 MG tablet Commonly known as:  FOLVITE Take 1 mg by mouth daily.   magnesium hydroxide 400 MG/5ML suspension Commonly known as: MILK OF MAGNESIA Take 30 mLs by mouth daily as needed for mild constipation.   mirtazapine 30 MG tablet Commonly known as: REMERON Take 30 mg by mouth at bedtime.   multivitamin with minerals tablet Take 1 tablet by mouth daily.   NON FORMULARY Diet order: Regular Diet.   polyethylene glycol 17 g packet Commonly known as: MIRALAX / GLYCOLAX Take 17 g by mouth 2  (two) times daily as needed for mild constipation (TO BE MIXED INTO 4 OUNCES OF WATER/EACH DOSE).   Polysaccharide-Iron Complex 150 MG Caps Take 150 mg by mouth daily.   Prevalon Misc See admin instructions. Prevalon boots- Apply to both feet daily, while in bed and may remove for AM and/or PM care   RA Saline Enema 19-7 GM/118ML Enem Place 1 enema rectally daily as needed (for constipation not relieved by Dulcolax suppository and call MD if no relief from enema).   senna-docusate 8.6-50 MG tablet Commonly known as: Senokot-S Take 2 tablets by mouth 2 (two) times daily.   thiamine 100 MG tablet Take 100 mg by mouth daily.   vitamin B-12 1000 MCG tablet Commonly known as: CYANOCOBALAMIN Take 1,000 mcg by mouth daily.   vitamin C 500 MG tablet Commonly known as: ASCORBIC ACID Take 500 mg by mouth 3 (three) times daily with meals.       No orders of the defined types were placed in this encounter.   Immunization History  Administered Date(s) Administered  . [place] SARS-COVID-2 Vaccination 04/25/2019    Social History   Tobacco Use  . Smoking status: Current Every Day Smoker    Packs/day: 0.50    Types: Cigarettes  . Smokeless tobacco: Never Used  Substance Use Topics  . Alcohol use: Never    Family history is mom with diabetes, hyperlipidemia, and hypertension.  History reviewed. No pertinent family history.    Review of Systems unable to obtain secondary to dementia; nursing-no acute concerns other than failure to thrive which has been going on for a while     Vitals:   05/13/19 1247  BP: 98/60  Pulse: 83  Resp: 18  Temp: 98.7 F (37.1 C)    SpO2 Readings from Last 1 Encounters:  05/10/19 98%   Body mass index is 16.23 kg/m.     Physical Exam  GENERAL APPEARANCE: Alert, non-conversant,  No acute distress.  But ill-appearing SKIN: 10 cm sacral decubitus stage IV not visualized HEAD: Normocephalic, atraumatic  EYES: Conjunctiva/lids clear.  Pupils round, reactive. EOMs intact.  EARS: External exam WNL, canals clear. Hearing grossly normal.  NOSE: No deformity or discharge.  MOUTH/THROAT: Lips w/o lesions  RESPIRATORY: Breathing is even, unlabored. Lung sounds are clear   CARDIOVASCULAR: Heart RRR no murmurs, rubs or gallops. No peripheral edema.   GASTROINTESTINAL: Abdomen is soft, non-tender, not distended w/ normal bowel sounds. GENITOURINARY: Bladder non tender, not distended  MUSCULOSKELETAL: Bilateral knee contractures NEUROLOGIC:  Cranial nerves 2-12 grossly intact. Moves all extremities  PSYCHIATRIC: Mood and affect with dementia, no behavioral issues  Patient Active Problem List   Diagnosis Date Noted  . Failure to thrive in adult   . Goals of care, counseling/discussion   . Palliative care by specialist   . Osteomyelitis ([place]) 05/07/2019  . DNR (do not resuscitate) present on admission 05/07/2019  . Osteomyelitis of lumbar spine ([place]) 12/24/2018  . Sacral decubitus ulcer, stage IV ([place]) 12/24/2018  .  Severe protein-calorie malnutrition Altamease Oiler: less than 60% of standard weight) ([place]) 12/24/2018  . Normocytic anemia 12/24/2018  . Vitamin D deficiency 12/24/2018  . Polymicrobial bacterial infection 12/21/2018  . Demyelinating disease of central nervous system ([place]) 12/02/2018  . Pressure ulcer, stage II ([place]) 12/02/2018  . Lumbar stenosis 11/24/2018  . Cervical stenosis of spine 11/24/2018  . Urinary retention 11/24/2018  . Tobacco abuse 11/24/2018  . Alcohol abuse 11/24/2018  . Hypokalemia 11/24/2018  . Hypomagnesemia 11/24/2018  . Hyponatremia 11/24/2018      Labs reviewed: Basic Metabolic Panel:    Component Value Date/Time   NA 135 05/10/2019 0408   NA 134 (A) 05/06/2019 0000   K 4.0 05/10/2019 0408   CL 99 05/10/2019 0408   CO2 26 05/10/2019 0408   GLUCOSE 85 05/10/2019 0408   BUN 14 05/10/2019 0408   BUN 14 05/06/2019 0000   CREATININE 0.53 05/10/2019 0408   CALCIUM 9.3 05/10/2019 0408    PROT 7.8 05/07/2019 1430   ALBUMIN 2.4 (L) 05/07/2019 1430   AST 15 05/07/2019 1430   ALT 10 05/07/2019 1430   ALKPHOS 52 05/07/2019 1430   BILITOT 0.4 05/07/2019 1430   GFRNONAA >60 05/10/2019 0408   GFRAA >60 05/10/2019 0408    Recent Labs    05/07/19 1430 05/08/19 0138 05/10/19 0408  NA 135 134* 135  K 4.3 4.5 4.0  CL 97* 98 99  CO2 26 26 26   GLUCOSE 102* 93 85  BUN 22 19 14   CREATININE 0.42* 0.46 0.53  CALCIUM 9.5 9.3 9.3   Liver Function Tests: Recent Labs    04/02/19 1205 04/08/19 0000 04/29/19 0000 05/06/19 0000 05/07/19 1430  AST 23   < > 15 12* 15  ALT 8   < > 5* 5* 10  ALKPHOS 53   < > 58 60 52  BILITOT 0.5  --   --   --  0.4  PROT 7.2  --   --   --  7.8  ALBUMIN 2.5*   < > 2.8* 3.0* 2.4*   < > = values in this interval not displayed.   No results for input(s): LIPASE, AMYLASE in the last 8760 hours. No results for input(s): AMMONIA in the last 8760 hours. CBC: Recent Labs    04/02/19 1205 04/30/19 0000 04/30/19 0000 05/06/19 0000 05/07/19 1430 05/07/19 1430 05/08/19 0138 05/09/19 0238 05/10/19 0408  WBC   < > 10.4   < > 11.4 10.4   < > 9.2 9.3 8.9  NEUTROABS  --  7  --  8 7.1  --   --   --   --   HGB   < > 7.4*   < > 6.8* 7.4*   < > 7.1* 7.0* 8.6*  HCT   < > 23*   < > 21* 24.7*   < > 23.2* 22.2* 26.8*  MCV   < >  --   --   --  81.3   < > 79.7* 77.6* 81.7  PLT   < > 731*   < > 943* 1,028*   < > 881* 868* 815*   < > = values in this interval not displayed.   Lipid No results for input(s): CHOL, HDL, LDLCALC, TRIG in the last 8760 hours.  Cardiac Enzymes: No results for input(s): CKTOTAL, CKMB, CKMBINDEX, TROPONINI in the last 8760 hours. BNP: No results for input(s): BNP in the last 8760 hours. No results found for: MICROALBUR No results found for:  HGBA1C No results found for: TSH No results found for: VITAMINB12 No results found for: FOLATE Lab Results  Component Value Date   IRON 26 (L) 05/08/2019   TIBC 168 (L) 05/08/2019    FERRITIN 608 (H) 05/08/2019    Imaging and Procedures obtained prior to SNF admission: MR SACRUM SI JOINTS W WO CONTRAST  Result Date: 05/07/2019 CLINICAL DATA:  Nonhealing wound over the buttock. EXAM: MRI LUMBAR SPINE WITHOUT AND WITH CONTRAST TECHNIQUE: Multiplanar and multiecho pulse sequences of the lumbar spine were obtained without and with intravenous contrast. CONTRAST:  64mL GADAVIST GADOBUTROL 1 MMOL/ML IV SOLN COMPARISON:  None. FINDINGS: Bones/Joint/Cartilage Large sacral decubitus ulcer extending to the cortex with mild sacrococcygeal edema concerning for mild osteomyelitis. No fracture or dislocation. Normal alignment. No joint effusion. Mild osteoarthritis of bilateral SI joints. Inter discal fluid at L3-4 and L4-5 which may be secondary to degenerative changes versus less likely discitis. Degenerative disease with disc height loss throughout the lumbar spine. Broad-based disc osteophyte complex at L3-4, L4-5 and L5-S1 with bilateral facet arthropathy. Severe bilateral foraminal stenosis at L4-5 and L5-S1. Ligaments, Muscles and Tendons Generalized muscle atrophy. T2 hyperintense signal within the right gluteus maximus muscle as can be seen with myositis versus neurogenic edema. Mild muscle edema and perifascial edema in the adductor muscles bilaterally. Soft tissue Generalized soft tissue edema in the subcutaneous fat. More focal soft tissue edema and inflammatory changes overlying the posterior aspect of the left ilium likely reflecting more focal cellulitis. No drainable fluid collection or hematoma. 4 x 2 cm T1 and T2 hyperintense soft tissue mass inferior to the right posterior hip joint likely reflecting a simple lipoma. IMPRESSION: 1. Large sacral decubitus ulcer extending to the cortex with mild sacrococcygeal edema concerning for mild osteomyelitis. 2. T2 hyperintense signal within the right gluteus maximus muscle. Mild muscle edema and perifascial edema in the adductor muscles  bilaterally. This may related to mild anasarca versus mild myositis. 3. Inter discal fluid at L3-4 and L4-5 which may be secondary to degenerative changes versus less likely discitis. 4. Diffuse lumbar spine spondylosis. Electronically Signed   By: Kathreen Devoid   On: 05/07/2019 14:41   DG Chest Portable 1 View  Result Date: 05/07/2019 CLINICAL DATA:  Chest pain, short of breath, hypotensive, sepsis EXAM: PORTABLE CHEST 1 VIEW COMPARISON:  11/06/2018 FINDINGS: The heart size and mediastinal contours are within normal limits. Both lungs are clear. The visualized skeletal structures are unremarkable. IMPRESSION: No active disease. Electronically Signed   By: Randa Ngo M.D.   On: 05/07/2019 15:02     Not all labs, radiology exams or other studies done during hospitalization come through on my EPIC note; however they are reviewed by me.    Assessment and Plan  Stage IV sacral decubitus ulcer/osteomyelitis-MRI findings consistent with mild osteomyelitis; patient status post 14 and half weeks of vancomycin at SNF; general surgery was consulted who did not feel there were any signs of infection or necrosis recommend continued antibiotics, no need for surgical debridement at this time.;  ID was consulted did not feel she had any significant infection or wound and recommend discontinuing all antibiotics SNF-admitted back to skilled nursing facility for supportive care.  Comfort care  Anemia of chronic disease-hemoglobin 6.8 at skilled nursing facility, 7 at the hospital.  Ferritin 608; status post 1 unit PRBC with a hemoglobin of 8.6 SNF-continue B12 and folate replacement; follow-up CBC  Failure to thrive/severe protein malnutrition-multifactorial due to advanced age nonambulatory with resulting  in multiple wounds on body, all began with spine surgeries and resultant year ago; patient patient is agreeable to return to [place] with hospice DNR in placeLetter unless complicated in my medical  facilityDesiring any further hospitalization, determine use of antibiotics when infection occurs, no IV fluids no feeding SNF-encourage oral intake supplements  Depression SNF-continue Remeron 30 mg nightly Swelling  Constipation SNF-continue Senokot-S 2 times daily MiraLAX as needed   Time spent greater than 35 minutes;> 50% of time with patient was spent reviewing records, labs, tests and studies, counseling and developing plan of care  [name] MD

## 2019-05-14 DIAGNOSIS — R319 Hematuria, unspecified: Secondary | ICD-10-CM | POA: Diagnosis not present

## 2019-05-14 DIAGNOSIS — N390 Urinary tract infection, site not specified: Secondary | ICD-10-CM | POA: Diagnosis not present

## 2019-05-14 DIAGNOSIS — Z79899 Other long term (current) drug therapy: Secondary | ICD-10-CM | POA: Diagnosis not present

## 2019-05-15 DIAGNOSIS — I10 Essential (primary) hypertension: Secondary | ICD-10-CM | POA: Diagnosis not present

## 2019-05-15 DIAGNOSIS — D649 Anemia, unspecified: Secondary | ICD-10-CM | POA: Diagnosis not present

## 2019-05-15 LAB — BASIC METABOLIC PANEL
BUN: 53 — AB (ref 4–21)
CO2: 20 (ref 13–22)
Chloride: 91 — AB (ref 99–108)
Creatinine: 1.3 — AB (ref 0.5–1.1)
Glucose: 169
Potassium: 4.4 (ref 3.4–5.3)
Sodium: 129 — AB (ref 137–147)

## 2019-05-15 LAB — CBC AND DIFFERENTIAL
HCT: 33 — AB (ref 36–46)
Hemoglobin: 10.4 — AB (ref 12.0–16.0)
Neutrophils Absolute: 22
Platelets: 691 — AB (ref 150–399)
WBC: 24.1

## 2019-05-15 LAB — COMPREHENSIVE METABOLIC PANEL
Calcium: 8.6 — AB (ref 8.7–10.7)
GFR calc Af Amer: 47.8
GFR calc non Af Amer: 41.24

## 2019-05-15 LAB — CBC: RBC: 4.14 (ref 3.87–5.11)

## 2019-05-17 ENCOUNTER — Encounter: Payer: Self-pay | Admitting: Internal Medicine

## 2019-05-17 ENCOUNTER — Non-Acute Institutional Stay (SKILLED_NURSING_FACILITY): Payer: PPO | Admitting: Internal Medicine

## 2019-05-17 DIAGNOSIS — R627 Adult failure to thrive: Secondary | ICD-10-CM

## 2019-05-17 DIAGNOSIS — K5900 Constipation, unspecified: Secondary | ICD-10-CM | POA: Insufficient documentation

## 2019-05-17 DIAGNOSIS — M8638 Chronic multifocal osteomyelitis, other site: Secondary | ICD-10-CM | POA: Diagnosis not present

## 2019-05-17 DIAGNOSIS — L89154 Pressure ulcer of sacral region, stage 4: Secondary | ICD-10-CM | POA: Diagnosis not present

## 2019-05-17 DIAGNOSIS — F325 Major depressive disorder, single episode, in full remission: Secondary | ICD-10-CM | POA: Insufficient documentation

## 2019-05-17 NOTE — Progress Notes (Signed)
Location:  [place] Room Number: 425-W Place of Service:  SNF (31)  [name], MD  Patient Care Team: [name], MD as PCP - General (Internal Medicine)  Extended Emergency Contact Information Primary Emergency Contact: Alycia Rossetti Mobile Phone: [phone] Relation: Aunt    Allergies: Ampicillin  Chief Complaint  Patient presents with  . Acute Visit    Patient is seen secondary to failure to thrive.    HPI: Patient is a 74 y.o. female who is being seen today for failure to thrive.  Patient is eating not at all and drinking night all.  Will not allow herself to be fed.  Patient is a DNR.  Patient does not appear uncomfortable   Past Medical History:  Diagnosis Date  . Alcohol abuse   . Demyelinating disorder ([place])   . Tobacco abuse     History reviewed. No pertinent surgical history.  Allergies as of 05/17/2019      Reactions   Ampicillin Rash   Severe rash with eosinophilia 01/2019. [facility] ID notes      Medication List       Accurate as of May 17, 2019  8:12 PM. If you have any questions, ask your nurse or doctor.        acetaminophen 500 MG tablet Commonly known as: TYLENOL Take 1,000 mg by mouth every 8 (eight) hours as needed for mild pain or headache (CANNOT EXCEED 3,000 MG TYLENOL/24 HOURS FROM ALL COMBINED SOURCES).   bisacodyl 10 MG suppository Commonly known as: DULCOLAX Place 10 mg rectally daily as needed for mild constipation or moderate constipation (NOT RELIEVED BY MILK OF MAGNESIA).   CALCIUM 500 PO Take 500 mg by mouth daily.   collagenase ointment Commonly known as: SANTYL Apply topically daily. Apply Santyl to left hip, right outer knee, right outer foot in 2 locations, and right inner plantar foot Q day, then cover with moist gauze and foam dressings.  (Change foam dressings Q 3 days or PRN soiling.)   D3-1000 25 MCG (1000 UT) capsule Generic drug: Cholecalciferol Take 1,000  Units by mouth daily.   ENSURE ENLIVE PO Take 237 mLs by mouth 3 (three) times daily.   NUTRITIONAL SUPPLEMENT PO Take 1 each by mouth See admin instructions. Magic Cup: Eat one serving 2 times a day- with lunch and dinner   feeding supplement (PRO-STAT SUGAR FREE 64) Liqd Take 30 mLs by mouth 2 (two) times daily.   folic acid 1 MG tablet Commonly known as: FOLVITE Take 1 mg by mouth daily.   magnesium hydroxide 400 MG/5ML suspension Commonly known as: MILK OF MAGNESIA Take 30 mLs by mouth daily as needed for mild constipation.   mirtazapine 30 MG tablet Commonly known as: REMERON Take 30 mg by mouth at bedtime.   multivitamin with minerals tablet Take 1 tablet by mouth daily.   NON FORMULARY Diet order: Regular Diet.   polyethylene glycol 17 g packet Commonly known as: MIRALAX / GLYCOLAX Take 17 g by mouth 2 (two) times daily as needed for mild constipation (TO BE MIXED INTO 4 OUNCES OF WATER/EACH DOSE).   Polysaccharide-Iron Complex 150 MG Caps Take 150 mg by mouth daily.   Prevalon Misc See admin instructions. Prevalon boots- Apply to both feet daily, while in bed and may remove for AM and/or PM care   RA Saline Enema 19-7 GM/118ML Enem Place 1 enema rectally daily as needed (for constipation not relieved by Dulcolax suppository and  call MD if no relief from enema).   senna-docusate 8.6-50 MG tablet Commonly known as: Senokot-S Take 2 tablets by mouth 2 (two) times daily.   thiamine 100 MG tablet Take 100 mg by mouth daily.   vitamin B-12 1000 MCG tablet Commonly known as: CYANOCOBALAMIN Take 1,000 mcg by mouth daily.   vitamin C 500 MG tablet Commonly known as: ASCORBIC ACID Take 500 mg by mouth 3 (three) times daily with meals.       No orders of the defined types were placed in this encounter.   Immunization History  Administered Date(s) Administered  . [place] SARS-COVID-2 Vaccination 04/25/2019    Social History   Tobacco Use  . Smoking  status: Current Every Day Smoker    Packs/day: 0.50    Types: Cigarettes  . Smokeless tobacco: Never Used  Substance Use Topics  . Alcohol use: Never    Review of Systems   unable to obtain secondary to patient weakness     Vitals:   05/17/19 2001  BP: 97/71  Pulse: 98  Resp: 19  Temp: 97.6 F (36.4 C)   Body mass index is 13.38 kg/m. Physical Exam  GENERAL APPEARANCE: Awake, no acute distress  SKIN: No diaphoresis rash HEENT: Unremarkable RESPIRATORY: Breathing is even, unlabored. Lung sounds are clear   CARDIOVASCULAR: Heart RRR no murmurs, rubs or gallops. No peripheral edema  GASTROINTESTINAL: Abdomen is soft, non-tender, not distended w/ normal bowel sounds.  GENITOURINARY: Bladder non tender, not distended  MUSCULOSKELETAL: Cachectic NEUROLOGIC: Cranial nerves 2-12 grossly intact. Moves all extremities PSYCHIATRIC: Mood and affect appropriate to situation, no behavioral issues  Patient Active Problem List   Diagnosis Date Noted  . Failure to thrive in adult   . Goals of care, counseling/discussion   . Palliative care by specialist   . Osteomyelitis ([place]) 05/07/2019  . DNR (do not resuscitate) present on admission 05/07/2019  . Osteomyelitis of lumbar spine ([place]) 12/24/2018  . Sacral decubitus ulcer, stage IV ([place]) 12/24/2018  . Severe protein-calorie malnutrition Altamease Oiler: less than 60% of standard weight) ([place]) 12/24/2018  . Normocytic anemia 12/24/2018  . Vitamin D deficiency 12/24/2018  . Polymicrobial bacterial infection 12/21/2018  . Demyelinating disease of central nervous system ([place]) 12/02/2018  . Pressure ulcer, stage II ([place]) 12/02/2018  . Lumbar stenosis 11/24/2018  . Cervical stenosis of spine 11/24/2018  . Urinary retention 11/24/2018  . Tobacco abuse 11/24/2018  . Alcohol abuse 11/24/2018  . Hypokalemia 11/24/2018  . Hypomagnesemia 11/24/2018  . Hyponatremia 11/24/2018    CMP     Component Value Date/Time   NA 135 05/10/2019 0408   NA  134 (A) 05/06/2019 0000   K 4.0 05/10/2019 0408   CL 99 05/10/2019 0408   CO2 26 05/10/2019 0408   GLUCOSE 85 05/10/2019 0408   BUN 14 05/10/2019 0408   BUN 14 05/06/2019 0000   CREATININE 0.53 05/10/2019 0408   CALCIUM 9.3 05/10/2019 0408   PROT 7.8 05/07/2019 1430   ALBUMIN 2.4 (L) 05/07/2019 1430   AST 15 05/07/2019 1430   ALT 10 05/07/2019 1430   ALKPHOS 52 05/07/2019 1430   BILITOT 0.4 05/07/2019 1430   GFRNONAA >60 05/10/2019 0408   GFRAA >60 05/10/2019 0408   Recent Labs    05/07/19 1430 05/08/19 0138 05/10/19 0408  NA 135 134* 135  K 4.3 4.5 4.0  CL 97* 98 99  CO2 26 26 26   GLUCOSE 102* 93 85  BUN 22 19 14   CREATININE 0.42* 0.46 0.53  CALCIUM 9.5 9.3 9.3   Recent Labs    04/02/19 1205 04/08/19 0000 04/29/19 0000 05/06/19 0000 05/07/19 1430  AST 23   < > 15 12* 15  ALT 8   < > 5* 5* 10  ALKPHOS 53   < > 58 60 52  BILITOT 0.5  --   --   --  0.4  PROT 7.2  --   --   --  7.8  ALBUMIN 2.5*   < > 2.8* 3.0* 2.4*   < > = values in this interval not displayed.   Recent Labs    04/02/19 1205 04/30/19 0000 04/30/19 0000 05/06/19 0000 05/07/19 1430 05/07/19 1430 05/08/19 0138 05/09/19 0238 05/10/19 0408  WBC   < > 10.4   < > 11.4 10.4   < > 9.2 9.3 8.9  NEUTROABS  --  7  --  8 7.1  --   --   --   --   HGB   < > 7.4*   < > 6.8* 7.4*   < > 7.1* 7.0* 8.6*  HCT   < > 23*   < > 21* 24.7*   < > 23.2* 22.2* 26.8*  MCV   < >  --   --   --  81.3   < > 79.7* 77.6* 81.7  PLT   < > 731*   < > 943* 1,028*   < > 881* 868* 815*   < > = values in this interval not displayed.   No results for input(s): CHOL, LDLCALC, TRIG in the last 8760 hours.  Invalid input(s): HCL No results found for: MICROALBUR No results found for: TSH No results found for: HGBA1C No results found for: CHOL, HDL, LDLCALC, LDLDIRECT, TRIG, CHOLHDL  Significant Diagnostic Results in last 30 days:  MR SACRUM SI JOINTS W WO CONTRAST  Result Date: 05/07/2019 CLINICAL DATA:  Nonhealing wound  over the buttock. EXAM: MRI LUMBAR SPINE WITHOUT AND WITH CONTRAST TECHNIQUE: Multiplanar and multiecho pulse sequences of the lumbar spine were obtained without and with intravenous contrast. CONTRAST:  80mL GADAVIST GADOBUTROL 1 MMOL/ML IV SOLN COMPARISON:  None. FINDINGS: Bones/Joint/Cartilage Large sacral decubitus ulcer extending to the cortex with mild sacrococcygeal edema concerning for mild osteomyelitis. No fracture or dislocation. Normal alignment. No joint effusion. Mild osteoarthritis of bilateral SI joints. Inter discal fluid at L3-4 and L4-5 which may be secondary to degenerative changes versus less likely discitis. Degenerative disease with disc height loss throughout the lumbar spine. Broad-based disc osteophyte complex at L3-4, L4-5 and L5-S1 with bilateral facet arthropathy. Severe bilateral foraminal stenosis at L4-5 and L5-S1. Ligaments, Muscles and Tendons Generalized muscle atrophy. T2 hyperintense signal within the right gluteus maximus muscle as can be seen with myositis versus neurogenic edema. Mild muscle edema and perifascial edema in the adductor muscles bilaterally. Soft tissue Generalized soft tissue edema in the subcutaneous fat. More focal soft tissue edema and inflammatory changes overlying the posterior aspect of the left ilium likely reflecting more focal cellulitis. No drainable fluid collection or hematoma. 4 x 2 cm T1 and T2 hyperintense soft tissue mass inferior to the right posterior hip joint likely reflecting a simple lipoma. IMPRESSION: 1. Large sacral decubitus ulcer extending to the cortex with mild sacrococcygeal edema concerning for mild osteomyelitis. 2. T2 hyperintense signal within the right gluteus maximus muscle. Mild muscle edema and perifascial edema in the adductor muscles bilaterally. This may related to mild anasarca versus mild myositis. 3. Inter discal fluid at  L3-4 and L4-5 which may be secondary to degenerative changes versus less likely discitis. 4.  Diffuse lumbar spine spondylosis. Electronically Signed   By: Kathreen Devoid   On: 05/07/2019 14:41   DG Chest Portable 1 View  Result Date: 05/07/2019 CLINICAL DATA:  Chest pain, short of breath, hypotensive, sepsis EXAM: PORTABLE CHEST 1 VIEW COMPARISON:  11/06/2018 FINDINGS: The heart size and mediastinal contours are within normal limits. Both lungs are clear. The visualized skeletal structures are unremarkable. IMPRESSION: No active disease. Electronically Signed   By: Randa Ngo M.D.   On: 05/07/2019 15:02    Assessment and Plan  Patient to thrive/large sacral ulcer stage IV with osteomyelitis-patient sed rate is greater than 150: She continues to have high white counts in the 30,000 from time to time, chest x-ray urines are looked at, that is the ulcer.  Patient will not survive this situation.  Patient appears comfortable at this time.  It is not time to start morphine.  Patient is a DNR.  Patient has already had 14-1/2 weeks of vancomycin. notes     [name], MD

## 2019-05-18 DIAGNOSIS — J069 Acute upper respiratory infection, unspecified: Secondary | ICD-10-CM | POA: Diagnosis not present

## 2019-05-19 ENCOUNTER — Encounter: Payer: Self-pay | Admitting: Internal Medicine

## 2019-05-20 ENCOUNTER — Encounter: Payer: Self-pay | Admitting: Internal Medicine

## 2019-05-20 ENCOUNTER — Non-Acute Institutional Stay (SKILLED_NURSING_FACILITY): Payer: PPO | Admitting: Internal Medicine

## 2019-05-20 DIAGNOSIS — L89154 Pressure ulcer of sacral region, stage 4: Secondary | ICD-10-CM

## 2019-05-20 DIAGNOSIS — Z515 Encounter for palliative care: Secondary | ICD-10-CM

## 2019-05-20 DIAGNOSIS — D649 Anemia, unspecified: Secondary | ICD-10-CM | POA: Diagnosis not present

## 2019-05-20 DIAGNOSIS — M8638 Chronic multifocal osteomyelitis, other site: Secondary | ICD-10-CM | POA: Diagnosis not present

## 2019-05-20 DIAGNOSIS — E43 Unspecified severe protein-calorie malnutrition: Secondary | ICD-10-CM | POA: Diagnosis not present

## 2019-05-20 LAB — BASIC METABOLIC PANEL
BUN: 124 — AB (ref 4–21)
CO2: 17 (ref 13–22)
Chloride: 95 — AB (ref 99–108)
Creatinine: 2.2 — AB (ref 0.5–1.1)
Glucose: 176
Potassium: 4.9 (ref 3.4–5.3)
Sodium: 140 (ref 137–147)

## 2019-05-20 LAB — COMPREHENSIVE METABOLIC PANEL
Albumin: 2.9 — AB (ref 3.5–5.0)
Calcium: 8.5 — AB (ref 8.7–10.7)
GFR calc Af Amer: 25.34
Globulin: 3.6

## 2019-05-20 LAB — HEPATIC FUNCTION PANEL
ALT: 6 — AB (ref 7–35)
AST: 14 (ref 13–35)
Alkaline Phosphatase: 88 (ref 25–125)
Bilirubin, Total: 0.4

## 2019-05-20 LAB — CBC AND DIFFERENTIAL
HCT: 41 (ref 36–46)
Hemoglobin: 13 (ref 12.0–16.0)
Neutrophils Absolute: 26
Platelets: 240 (ref 150–399)
WBC: 27.8

## 2019-05-20 LAB — CBC: RBC: 4.96 (ref 3.87–5.11)

## 2019-05-20 NOTE — Progress Notes (Signed)
Location:  [place] Room Number: 425-W Place of Service:  SNF (31)  [name], MD  Patient Care Team: [name], MD as PCP - General (Internal Medicine)  Extended Emergency Contact Information Primary Emergency Contact: Alycia Rossetti Mobile Phone: [phone] Relation: Aunt    Allergies: Ampicillin  Chief Complaint  Patient presents with  . Acute Visit    Patient seen for end-of-life care.    HPI: Patient is a 74 y.o. female who is being seen because the director of nursing believes that she is transitioning.  She is not fully awake and she does not appear to be in any discomfort.  Past Medical History:  Diagnosis Date  . Alcohol abuse   . Demyelinating disorder ([place])   . Tobacco abuse     History reviewed. No pertinent surgical history.  Allergies as of 05/20/2019      Reactions   Ampicillin Rash   Severe rash with eosinophilia 01/2019. [facility] ID notes      Medication List       Accurate as of May 20, 2019 11:59 PM. If you have any questions, ask your nurse or doctor.        acetaminophen 500 MG tablet Commonly known as: TYLENOL Take 1,000 mg by mouth every 8 (eight) hours as needed for mild pain or headache (CANNOT EXCEED 3,000 MG TYLENOL/24 HOURS FROM ALL COMBINED SOURCES).   bisacodyl 10 MG suppository Commonly known as: DULCOLAX Place 10 mg rectally daily as needed for mild constipation or moderate constipation (NOT RELIEVED BY MILK OF MAGNESIA).   CALCIUM 500 PO Take 500 mg by mouth daily.   collagenase ointment Commonly known as: SANTYL Apply topically daily. Apply Santyl to left hip, right outer knee, right outer foot in 2 locations, and right inner plantar foot Q day, then cover with moist gauze and foam dressings.  (Change foam dressings Q 3 days or PRN soiling.)   D3-1000 25 MCG (1000 UT) capsule Generic drug: Cholecalciferol Take 1,000 Units by mouth daily.   ENSURE ENLIVE PO Take  237 mLs by mouth 3 (three) times daily.   NUTRITIONAL SUPPLEMENT PO Take 1 each by mouth See admin instructions. Magic Cup: Eat one serving 2 times a day- with lunch and dinner   feeding supplement (PRO-STAT SUGAR FREE 64) Liqd Take 30 mLs by mouth 2 (two) times daily.   folic acid 1 MG tablet Commonly known as: FOLVITE Take 1 mg by mouth daily.   magnesium hydroxide 400 MG/5ML suspension Commonly known as: MILK OF MAGNESIA Take 30 mLs by mouth daily as needed for mild constipation.   mirtazapine 30 MG tablet Commonly known as: REMERON Take 30 mg by mouth at bedtime.   multivitamin with minerals tablet Take 1 tablet by mouth daily.   NON FORMULARY Diet order: Regular Diet.   polyethylene glycol 17 g packet Commonly known as: MIRALAX / GLYCOLAX Take 17 g by mouth 2 (two) times daily as needed for mild constipation (TO BE MIXED INTO 4 OUNCES OF WATER/EACH DOSE).   Polysaccharide-Iron Complex 150 MG Caps Take 150 mg by mouth daily.   Prevalon Misc See admin instructions. Prevalon boots- Apply to both feet daily, while in bed and may remove for AM and/or PM care   RA Saline Enema 19-7 GM/118ML Enem Place 1 enema rectally daily as needed (for constipation not relieved by Dulcolax suppository and call MD if no relief from enema).   senna-docusate 8.6-50 MG tablet Commonly  known as: Senokot-S Take 2 tablets by mouth 2 (two) times daily.   thiamine 100 MG tablet Take 100 mg by mouth daily.   vitamin B-12 1000 MCG tablet Commonly known as: CYANOCOBALAMIN Take 1,000 mcg by mouth daily.   vitamin C 500 MG tablet Commonly known as: ASCORBIC ACID Take 500 mg by mouth 3 (three) times daily with meals.       No orders of the defined types were placed in this encounter.   Immunization History  Administered Date(s) Administered  . [place] SARS-COVID-2 Vaccination 04/25/2019    Social History   Tobacco Use  . Smoking status: Current Every Day Smoker    Packs/day:  0.50    Types: Cigarettes  . Smokeless tobacco: Never Used  Substance Use Topics  . Alcohol use: Never    Review of Systems unable to obtain secondary to condition  Vitals:   05/20/19 1600  BP: 114/71  Pulse: (!) 122  Resp: (!) 22  Temp: (!) 96.8 F (36 C)  SpO2: 96%   Body mass index is 13.38 kg/m. Physical Exam  GENERAL APPEARANCE: Somnolent no acute distress  SKIN: No diaphoresis rash HEENT: Unremarkable RESPIRATORY: Breathing is even, unlabored.  Increased rate lung sounds are clear   CARDIOVASCULAR: Heart regular, increased rate no murmurs, rubs or gallops. No peripheral edema  GASTROINTESTINAL: Abdomen is soft, non-tender, not distended w/ normal bowel sounds.  GENITOURINARY: Bladder non tender, not distended  MUSCULOSKELETAL: Very thin NEUROLOGIC: Patient did not move PSYCHIATRIC: Not applicable  Patient Active Problem List   Diagnosis Date Noted  . Constipation 05/17/2019  . Depression, major, single episode, complete remission ([place]) 05/17/2019  . Failure to thrive in adult   . Goals of care, counseling/discussion   . Palliative care by specialist   . Osteomyelitis ([place]) 05/07/2019  . DNR (do not resuscitate) present on admission 05/07/2019  . Osteomyelitis of lumbar spine ([place]) 12/24/2018  . Sacral decubitus ulcer, stage IV ([place]) 12/24/2018  . Severe protein-calorie malnutrition Altamease Oiler: less than 60% of standard weight) ([place]) 12/24/2018  . Macrocytic anemia 12/24/2018  . Vitamin D deficiency 12/24/2018  . Polymicrobial bacterial infection 12/21/2018  . Demyelinating disease of central nervous system ([place]) 12/02/2018  . Pressure ulcer, stage II ([place]) 12/02/2018  . Lumbar stenosis 11/24/2018  . Cervical stenosis of spine 11/24/2018  . Urinary retention 11/24/2018  . Tobacco abuse 11/24/2018  . Alcohol abuse 11/24/2018  . Hypokalemia 11/24/2018  . Hypomagnesemia 11/24/2018  . Hyponatremia 11/24/2018    CMP     Component Value Date/Time   NA 140  05/20/2019 0000   K 4.9 05/20/2019 0000   CL 95 (A) 05/20/2019 0000   CO2 17 05/20/2019 0000   GLUCOSE 85 05/10/2019 0408   BUN 124 (A) 05/20/2019 0000   CREATININE 2.2 (A) 05/20/2019 0000   CREATININE 0.53 05/10/2019 0408   CALCIUM 8.5 (A) 05/20/2019 0000   PROT 7.8 05/07/2019 1430   ALBUMIN 2.9 (A) 05/20/2019 0000   AST 14 05/20/2019 0000   ALT 6 (A) 05/20/2019 0000   ALKPHOS 88 05/20/2019 0000   BILITOT 0.4 05/07/2019 1430   GFRNONAA 21..87 05/20/2019 0000   GFRAA 25.34 05/20/2019 0000   Recent Labs    05/07/19 1430 05/07/19 1430 05/08/19 0138 05/08/19 0138 05/10/19 0408 05/15/19 0000 05/20/19 0000  NA 135   < > 134*   < > 135 129* 140  K 4.3   < > 4.5   < > 4.0 4.4 4.9  CL 97*   < >  98   < > 99 91* 95*  CO2 26   < > 26   < > 26 20 17   GLUCOSE 102*  --  93  --  85  --   --   BUN 22   < > 19   < > 14 53* 124*  CREATININE 0.42*   < > 0.46   < > 0.53 1.3* 2.2*  CALCIUM 9.5   < > 9.3   < > 9.3 8.6* 8.5*   < > = values in this interval not displayed.   Recent Labs    04/02/19 1205 04/08/19 0000 05/06/19 0000 05/07/19 1430 05/20/19 0000  AST 23   < > 12* 15 14  ALT 8   < > 5* 10 6*  ALKPHOS 53   < > 60 52 88  BILITOT 0.5  --   --  0.4  --   PROT 7.2  --   --  7.8  --   ALBUMIN 2.5*   < > 3.0* 2.4* 2.9*   < > = values in this interval not displayed.   Recent Labs    05/07/19 1430 05/07/19 1430 05/08/19 0138 05/08/19 0138 05/09/19 0238 05/09/19 0238 05/10/19 0408 05/15/19 0000 05/20/19 0000  WBC 10.4   < > 9.2   < > 9.3   < > 8.9 24.1 27.8  NEUTROABS 7.1  --   --   --   --   --   --  22 26  HGB 7.4*   < > 7.1*   < > 7.0*   < > 8.6* 10.4* 13.0  HCT 24.7*   < > 23.2*   < > 22.2*   < > 26.8* 33* 41  MCV 81.3   < > 79.7*  --  77.6*  --  81.7  --   --   PLT 1,028*   < > 881*   < > 868*   < > 815* 691* 240   < > = values in this interval not displayed.   No results for input(s): CHOL, LDLCALC, TRIG in the last 8760 hours.  Invalid input(s): HCL No  results found for: MICROALBUR No results found for: TSH No results found for: HGBA1C No results found for: CHOL, HDL, LDLCALC, LDLDIRECT, TRIG, CHOLHDL  Significant Diagnostic Results in last 30 days:  MR SACRUM SI JOINTS W WO CONTRAST  Result Date: 05/07/2019 CLINICAL DATA:  Nonhealing wound over the buttock. EXAM: MRI LUMBAR SPINE WITHOUT AND WITH CONTRAST TECHNIQUE: Multiplanar and multiecho pulse sequences of the lumbar spine were obtained without and with intravenous contrast. CONTRAST:  59mL GADAVIST GADOBUTROL 1 MMOL/ML IV SOLN COMPARISON:  None. FINDINGS: Bones/Joint/Cartilage Large sacral decubitus ulcer extending to the cortex with mild sacrococcygeal edema concerning for mild osteomyelitis. No fracture or dislocation. Normal alignment. No joint effusion. Mild osteoarthritis of bilateral SI joints. Inter discal fluid at L3-4 and L4-5 which may be secondary to degenerative changes versus less likely discitis. Degenerative disease with disc height loss throughout the lumbar spine. Broad-based disc osteophyte complex at L3-4, L4-5 and L5-S1 with bilateral facet arthropathy. Severe bilateral foraminal stenosis at L4-5 and L5-S1. Ligaments, Muscles and Tendons Generalized muscle atrophy. T2 hyperintense signal within the right gluteus maximus muscle as can be seen with myositis versus neurogenic edema. Mild muscle edema and perifascial edema in the adductor muscles bilaterally. Soft tissue Generalized soft tissue edema in the subcutaneous fat. More focal soft tissue edema and inflammatory changes overlying the posterior aspect  of the left ilium likely reflecting more focal cellulitis. No drainable fluid collection or hematoma. 4 x 2 cm T1 and T2 hyperintense soft tissue mass inferior to the right posterior hip joint likely reflecting a simple lipoma. IMPRESSION: 1. Large sacral decubitus ulcer extending to the cortex with mild sacrococcygeal edema concerning for mild osteomyelitis. 2. T2 hyperintense  signal within the right gluteus maximus muscle. Mild muscle edema and perifascial edema in the adductor muscles bilaterally. This may related to mild anasarca versus mild myositis. 3. Inter discal fluid at L3-4 and L4-5 which may be secondary to degenerative changes versus less likely discitis. 4. Diffuse lumbar spine spondylosis. Electronically Signed   By: Kathreen Devoid   On: 05/07/2019 14:41   DG Chest Portable 1 View  Result Date: 05/07/2019 CLINICAL DATA:  Chest pain, short of breath, hypotensive, sepsis EXAM: PORTABLE CHEST 1 VIEW COMPARISON:  11/06/2018 FINDINGS: The heart size and mediastinal contours are within normal limits. Both lungs are clear. The visualized skeletal structures are unremarkable. IMPRESSION: No active disease. Electronically Signed   By: Randa Ngo M.D.   On: 05/07/2019 15:02    Assessment and Plan  Sacral osteomyelitis/end-of-life-I have written for morphine but patient does not appear uncomfortable except of the physiologic increased respiratory rate and increased heart rate.  Morphine solution as needed has been ordered.    [name], MD

## 2019-05-25 ENCOUNTER — Encounter: Payer: Self-pay | Admitting: Internal Medicine

## 2019-05-25 DIAGNOSIS — Z515 Encounter for palliative care: Secondary | ICD-10-CM | POA: Insufficient documentation

## 2019-05-27 DEATH — deceased

## 2020-06-01 IMAGING — DX DG CHEST 1V PORT
1 series · 1 of 1 positions shown · non-contrast
Comparison: 11/06/2018

CLINICAL DATA: Chest pain, short of breath, hypotensive, sepsis

EXAM:
PORTABLE CHEST 1 VIEW

[chest]
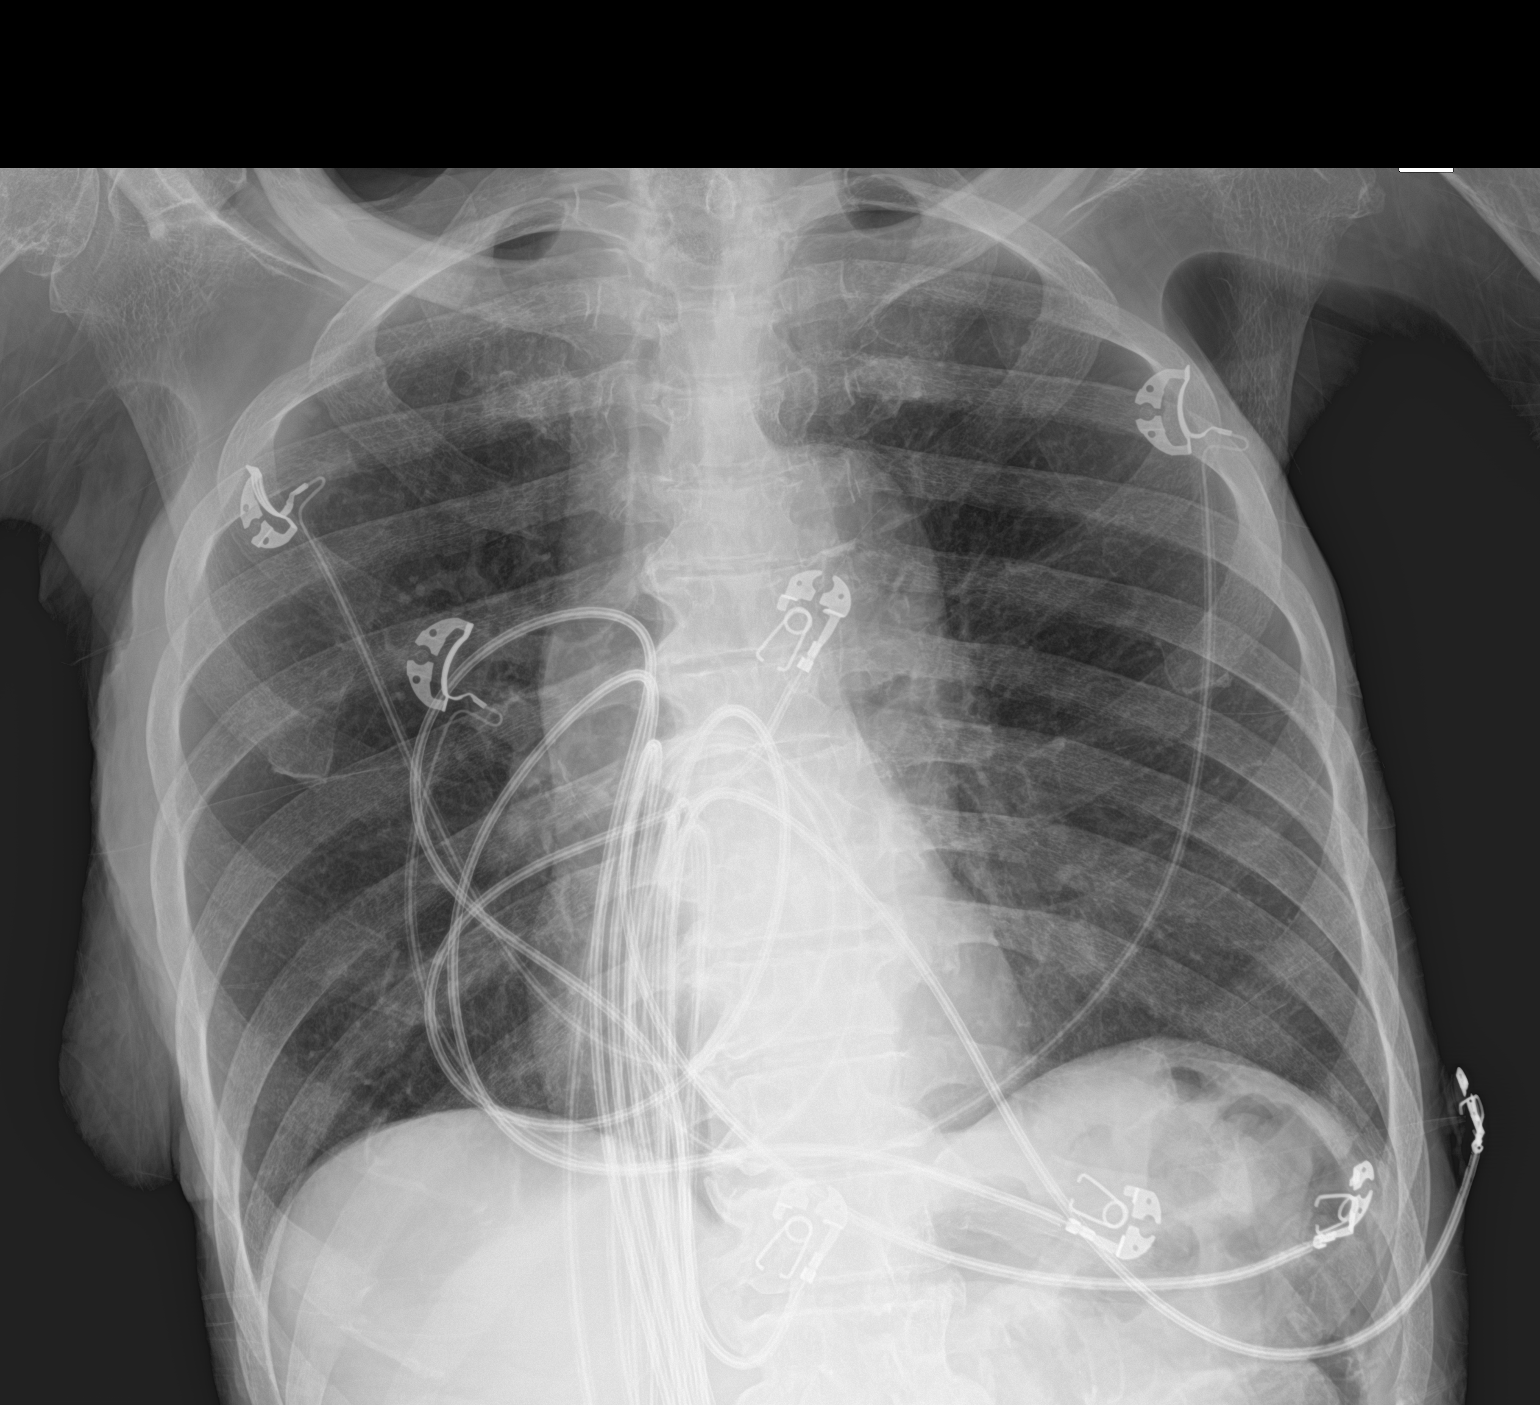

[1 of 1 positions shown; findings below may reference images not displayed]

FINDINGS: The heart size and mediastinal contours are within normal limits.
Both lungs are clear. The visualized skeletal structures are
unremarkable.
IMPRESSION: No active disease.
